# Patient Record
Sex: Female | Born: 1955 | Race: White | Hispanic: No | Smoking: Never smoker
Health system: Southern US, Community
[De-identification: ages and names within clinical notes are randomized; demographics above are authoritative.]

## PROBLEM LIST (undated history)

## (undated) DIAGNOSIS — R0609 Other forms of dyspnea: Secondary | ICD-10-CM

## (undated) DIAGNOSIS — I10 Essential (primary) hypertension: Secondary | ICD-10-CM

## (undated) DIAGNOSIS — G629 Polyneuropathy, unspecified: Secondary | ICD-10-CM

## (undated) DIAGNOSIS — F988 Other specified behavioral and emotional disorders with onset usually occurring in childhood and adolescence: Secondary | ICD-10-CM

## (undated) DIAGNOSIS — R7301 Impaired fasting glucose: Secondary | ICD-10-CM

## (undated) DIAGNOSIS — E119 Type 2 diabetes mellitus without complications: Secondary | ICD-10-CM

## (undated) DIAGNOSIS — F32A Depression, unspecified: Secondary | ICD-10-CM

## (undated) DIAGNOSIS — J302 Other seasonal allergic rhinitis: Secondary | ICD-10-CM

## (undated) DIAGNOSIS — K219 Gastro-esophageal reflux disease without esophagitis: Secondary | ICD-10-CM

## (undated) DIAGNOSIS — Q2544 Congenital dilation of aorta: Secondary | ICD-10-CM

## (undated) DIAGNOSIS — L209 Atopic dermatitis, unspecified: Secondary | ICD-10-CM

## (undated) HISTORY — DX: Depression, unspecified: F32.A

## (undated) HISTORY — DX: Atopic dermatitis, unspecified: L20.9

## (undated) HISTORY — DX: Polyneuropathy, unspecified: G62.9

## (undated) HISTORY — DX: Other specified behavioral and emotional disorders with onset usually occurring in childhood and adolescence: F98.8

## (undated) HISTORY — DX: Congenital dilation of aorta: Q25.44

## (undated) HISTORY — DX: Other seasonal allergic rhinitis: J30.2

## (undated) HISTORY — DX: Essential (primary) hypertension: I10

## (undated) HISTORY — PX: OTHER SURGICAL HISTORY: SHX169

## (undated) HISTORY — DX: Gastro-esophageal reflux disease without esophagitis: K21.9

## (undated) HISTORY — DX: Other forms of dyspnea: R06.09

## (undated) HISTORY — DX: Impaired fasting glucose: R73.01

---

## 1998-08-03 ENCOUNTER — Other Ambulatory Visit: Admission: RE | Admit: 1998-08-03 | Discharge: 1998-08-03 | Payer: Self-pay | Admitting: Obstetrics & Gynecology

## 1999-12-30 ENCOUNTER — Encounter: Payer: Self-pay | Admitting: Internal Medicine

## 1999-12-30 ENCOUNTER — Encounter: Admission: RE | Admit: 1999-12-30 | Discharge: 1999-12-30 | Payer: Self-pay | Admitting: Internal Medicine

## 1999-12-30 ENCOUNTER — Encounter: Admission: RE | Admit: 1999-12-30 | Discharge: 2000-03-29 | Payer: Self-pay | Admitting: Internal Medicine

## 2000-01-10 ENCOUNTER — Encounter: Payer: Self-pay | Admitting: Internal Medicine

## 2000-01-10 ENCOUNTER — Encounter: Admission: RE | Admit: 2000-01-10 | Discharge: 2000-01-10 | Payer: Self-pay | Admitting: Internal Medicine

## 2006-09-26 ENCOUNTER — Emergency Department (HOSPITAL_COMMUNITY): Admission: EM | Admit: 2006-09-26 | Discharge: 2006-09-26 | Payer: Self-pay | Admitting: Emergency Medicine

## 2008-06-17 ENCOUNTER — Other Ambulatory Visit: Admission: RE | Admit: 2008-06-17 | Discharge: 2008-06-17 | Payer: Self-pay | Admitting: Internal Medicine

## 2011-05-18 ENCOUNTER — Ambulatory Visit (INDEPENDENT_AMBULATORY_CARE_PROVIDER_SITE_OTHER): Payer: Federal, State, Local not specified - PPO

## 2011-05-18 DIAGNOSIS — J411 Mucopurulent chronic bronchitis: Secondary | ICD-10-CM

## 2011-05-18 DIAGNOSIS — J343 Hypertrophy of nasal turbinates: Secondary | ICD-10-CM

## 2011-05-18 DIAGNOSIS — L241 Irritant contact dermatitis due to oils and greases: Secondary | ICD-10-CM

## 2011-12-25 ENCOUNTER — Ambulatory Visit: Payer: Federal, State, Local not specified - PPO

## 2011-12-25 ENCOUNTER — Ambulatory Visit (INDEPENDENT_AMBULATORY_CARE_PROVIDER_SITE_OTHER): Payer: Federal, State, Local not specified - PPO | Admitting: Emergency Medicine

## 2011-12-25 VITALS — BP 109/81 | HR 73 | Temp 97.5°F | Resp 18 | Ht 65.0 in | Wt 161.0 lb

## 2011-12-25 DIAGNOSIS — M25579 Pain in unspecified ankle and joints of unspecified foot: Secondary | ICD-10-CM

## 2011-12-25 DIAGNOSIS — S93409A Sprain of unspecified ligament of unspecified ankle, initial encounter: Secondary | ICD-10-CM

## 2011-12-25 MED ORDER — NAPROXEN SODIUM 550 MG PO TABS: 550.0000 mg | ORAL_TABLET | Freq: Two times a day (BID) | ORAL | Status: DC

## 2011-12-25 NOTE — Progress Notes (Signed)
   Date:  12/25/2011   Name:  Karina Lynch   DOB:  [birth year]   MRN:  [id number] Gender: female Age: 56 y.o.  PCP:  No primary provider on file.    Chief Complaint: Ankle Pain   History of Present Illness:  Karina Lynch is a 56 y.o. pleasant patient who presents with the following:  Injured in aerobics and has persistent pain in left lateral ankle.  Not improving rapidly but is less painful than initially.  Has persistent lump in lateral posterior foot and ankle.  There is no problem list on file for this patient.   No past medical history on file.  No past surgical history on file.  History  Substance Use Topics  . Smoking status: Never Smoker   . Smokeless tobacco: Not on file  . Alcohol Use: Not on file    No family history on file.  No Known Allergies  Medication list has been reviewed and updated.  Current Outpatient Prescriptions on File Prior to Visit  Medication Sig Dispense Refill  . amphetamine-dextroamphetamine (ADDERALL) 20 MG tablet Take 20 mg by mouth daily.      . temazepam (RESTORIL) 15 MG capsule Take 15 mg by mouth at bedtime as needed.        Review of Systems:  As per HPI, otherwise negative.    Physical Examination: Filed Vitals:   12/25/11 1142  BP: 109/81  Pulse: 73  Temp: 97.5 F (36.4 C)  Resp: 18   Filed Vitals:   12/25/11 1142  Height: 5\' 5"  (1.651 m)  Weight: 161 lb (73.029 kg)   Body mass index is 26.79 kg/(m^2). Ideal Body Weight: Weight in (lb) to have BMI = 25: 149.9    GEN: WDWN, NAD, Non-toxic, Alert & Oriented x 3 HEENT: Atraumatic, Normocephalic.  Ears and Nose: No external deformity. EXTR: No clubbing/cyanosis/edema.  Tender fullness just distal and posterior to lateral malleolus.  Pain with eversion of ankle.  NATI NEURO: Normal gait.  PSYCH: Normally interactive. Conversant. Not depressed or anxious appearing.  Calm demeanor.    Assessment and Plan: Ankle sprain Air cast Anaprox Follow up as  needed  Carmelina Dane, MD

## 2011-12-25 NOTE — Progress Notes (Signed)
  Subjective:    Patient ID: Karina Lynch, female    DOB: 1956/01/06, 56 y.o.   MRN: [id number]  HPI    Review of Systems     Objective:   Physical Exam        Assessment & Plan:  [facility] reading (PRIMARY) by  Dr. Dareen Piano. Negative .

## 2012-06-03 ENCOUNTER — Ambulatory Visit (HOSPITAL_COMMUNITY)
Admission: RE | Admit: 2012-06-03 | Discharge: 2012-06-03 | Disposition: A | Discharge status: Home / Self Care | Payer: Federal, State, Local not specified - PPO | Source: Ambulatory Visit | Attending: Physician Assistant | Admitting: Physician Assistant

## 2012-06-03 ENCOUNTER — Ambulatory Visit (INDEPENDENT_AMBULATORY_CARE_PROVIDER_SITE_OTHER): Payer: Federal, State, Local not specified - PPO | Admitting: Family Medicine

## 2012-06-03 ENCOUNTER — Ambulatory Visit: Payer: Federal, State, Local not specified - PPO

## 2012-06-03 VITALS — BP 115/69 | HR 95 | Temp 98.6°F | Resp 16 | Ht 66.5 in | Wt 168.0 lb

## 2012-06-03 DIAGNOSIS — R05 Cough: Secondary | ICD-10-CM

## 2012-06-03 DIAGNOSIS — N390 Urinary tract infection, site not specified: Secondary | ICD-10-CM

## 2012-06-03 DIAGNOSIS — R300 Dysuria: Secondary | ICD-10-CM

## 2012-06-03 DIAGNOSIS — R0602 Shortness of breath: Secondary | ICD-10-CM

## 2012-06-03 DIAGNOSIS — R059 Cough, unspecified: Secondary | ICD-10-CM

## 2012-06-03 DIAGNOSIS — F909 Attention-deficit hyperactivity disorder, unspecified type: Secondary | ICD-10-CM | POA: Insufficient documentation

## 2012-06-03 DIAGNOSIS — G4700 Insomnia, unspecified: Secondary | ICD-10-CM | POA: Insufficient documentation

## 2012-06-03 LAB — POCT UA - MICROSCOPIC ONLY
Crystals, Ur, HPF, POC: NEGATIVE
Mucus, UA: NEGATIVE
Yeast, UA: NEGATIVE

## 2012-06-03 LAB — POCT URINALYSIS DIPSTICK
Bilirubin, UA: NEGATIVE
Glucose, UA: NEGATIVE
Nitrite, UA: NEGATIVE

## 2012-06-03 LAB — CBC
Platelets: 275 10*3/uL (ref 150–400)
RBC: 3.96 MIL/uL (ref 3.87–5.11)
RDW: 13.4 % (ref 11.5–15.5)
WBC: 14.7 10*3/uL — ABNORMAL HIGH (ref 4.0–10.5)

## 2012-06-03 LAB — D-DIMER, QUANTITATIVE: D-Dimer, Quant: 2.2 ug/mL-FEU — ABNORMAL HIGH (ref 0.00–0.48)

## 2012-06-03 MED ORDER — IOHEXOL 350 MG/ML SOLN
100.0000 mL | Freq: Once | INTRAVENOUS | Status: AC | PRN
  Administered 2012-06-03: 80 mL via INTRAVENOUS

## 2012-06-03 MED ORDER — LEVOFLOXACIN 500 MG PO TABS: 500.0000 mg | ORAL_TABLET | Freq: Every day | ORAL | Status: DC

## 2012-06-03 MED ORDER — HYDROCOD POLST-CHLORPHEN POLST 10-8 MG/5ML PO LQCR: 5.0000 mL | Freq: Two times a day (BID) | ORAL | Status: DC

## 2012-06-03 NOTE — Progress Notes (Signed)
[address] [place] [address]   Phone [phone]  Subjective:    Patient ID: Karina Lynch, female    DOB: 1955/10/07, 57 y.o.   MRN: [id number]  HPI Pt presents to clinic with 2 complaints - some dysuria over the last several days at the end of her stream - she has no urgency or frequency - she has no back pain and no fevers today - she did wake up last pm with wet sheets - and had fevers last week when she had flu-like symptoms.  Her flu-like symptoms started 1 wk ago and she had myalgias and fevers but that is all better.  She has a slight cough but seems to be related to deep breaths and is better today.  She has had some SOB but it is not significant.  She has no chest pain, no leg pain.  She laid around a little more than normal last week.  She has taken no medications for her symptoms.     Review of Systems  Constitutional: Positive for fever (last night - subj fever). Negative for chills.  HENT: Positive for congestion and postnasal drip. Negative for sore throat and rhinorrhea.   Respiratory: Positive for cough and shortness of breath (slightly).   Genitourinary: Positive for dysuria. Negative for urgency, frequency and hematuria.  Neurological: Negative for headaches.       Objective:   Physical Exam  Vitals reviewed. Constitutional: She is oriented to person, place, and time. She appears well-developed and well-nourished.  HENT:  Head: Normocephalic and atraumatic.  Right Ear: Hearing, tympanic membrane, external ear and ear canal normal.  Left Ear: Hearing, tympanic membrane, external ear and ear canal normal.  Nose: Mucosal edema (zred) present.  Mouth/Throat: Uvula is midline, oropharynx is clear and moist and mucous membranes are normal.  Eyes: Conjunctivae normal are normal.  Cardiovascular: Normal rate, regular rhythm and normal heart sounds.   Pulmonary/Chest: Effort normal. She has no decreased breath sounds. She has no wheezes.       Pt sounds winded  during taking her history - unable to finish full sentences but pt does not feel like it is due to nasal congestion -   Abdominal: There is no CVA tenderness.  Musculoskeletal:       No calf tenderness - no palpable cords  Neurological: She is alert and oriented to person, place, and time.  Skin: Skin is warm and dry.  Psychiatric: She has a normal mood and affect. Her behavior is normal. Judgment and thought content normal.    Results for orders placed in visit on 06/03/12  POCT UA - MICROSCOPIC ONLY      Component Value Range   WBC, Ur, HPF, POC tntc     RBC, urine, microscopic 2-5     Bacteria, U Microscopic trace     Mucus, UA neg     Epithelial cells, urine per micros 0-1     Crystals, Ur, HPF, POC neg     Casts, Ur, LPF, POC renal tubular     Yeast, UA neg    POCT URINALYSIS DIPSTICK      Component Value Range   Color, UA yellow     Clarity, UA cloudy     Glucose, UA neg     Bilirubin, UA neg     Ketones, UA neg     Spec Grav, UA <=1.005     Blood, UA moderate     pH, UA 5.5  Protein, UA 30     Urobilinogen, UA 0.2     Nitrite, UA neg     Leukocytes, UA large (3+)     [facility] reading (PRIMARY) by  Karina Lynch. Neg.       Assessment & Plan:   1. Dysuria  POCT UA - Microscopic Only, POCT urinalysis dipstick  2. UTI (lower urinary tract infection)  levofloxacin (LEVAQUIN) 500 MG tablet, Urine culture  3. SOB (shortness of breath)  DG Chest 2 View, D-dimer, quantitative, CBC  4. Cough  chlorpheniramine-HYDROcodone (TUSSIONEX PENNKINETIC ER) 10-8 MG/5ML LQCR    1/2 - treat for UTI - do not think pyelonephritis due to lack of nausea, fever and CVA tenderness 3/4 - due to normal HR and normal pulse ox think low likelihood of PE but will do a D dimer - ? Early pneumonia that is not evident on CXR due to elevated white count -  will use Levaquin to cover UTI/pyelonephritis and PNA  Answered pt's questions - she understands and agrees with the above.  D/with Karina  Dareen Lynch.    After patient left - D Dimer returned high so called patient and sent her to [facility] for CT angiogram - report will be called to Karina Lynch.  If PE will go to ED and if normal - ok to home.

## 2012-06-05 LAB — URINE CULTURE

## 2012-07-17 ENCOUNTER — Encounter: Payer: Self-pay | Admitting: Physician Assistant

## 2014-01-15 IMAGING — CT CT ANGIO CHEST
1 of 2 series · 19 of 32 positions shown · IV contrast (OMNIPAQUE 300)
Comparison: None.

CLINICAL DATA: Shortness of breath with positive D-dimer.

CT ANGIOGRAPHY CHEST
TECHNIQUE: Multidetector CT imaging of the chest using the
standard protocol during bolus administration of intravenous
contrast. Multiplanar reconstructed images including MIPs were
obtained and reviewed to evaluate the vascular anatomy.
Contrast: 80mL OMNIPAQUE IOHEXOL 350 MG/ML SOLN

[Series 11: thins for pacs · axial · 0.74mm/px · z∈[+1272,+1488]mm · 19 of 239 slices shown]
[im 12/239  lung]
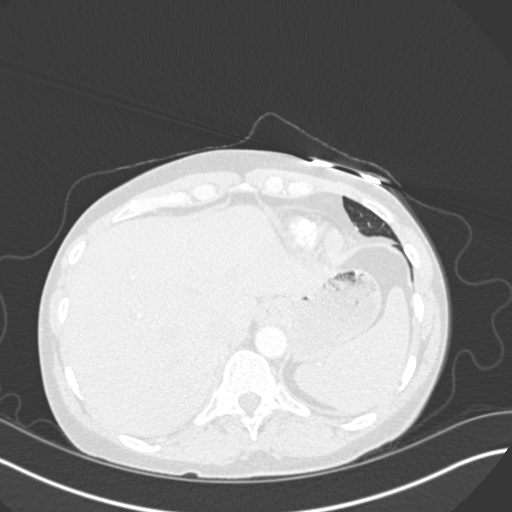
[im 24/239  mediastinal]
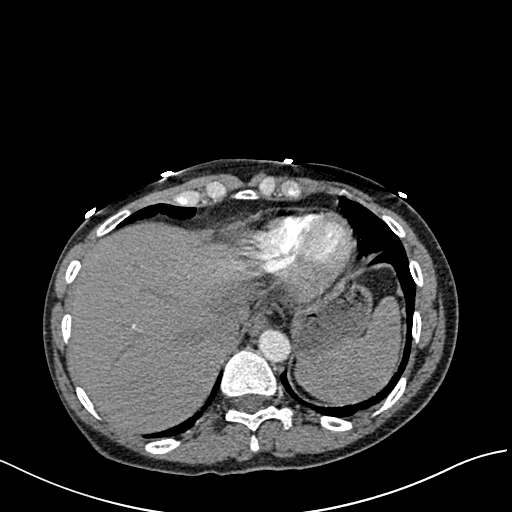
[im 36/239  lung]
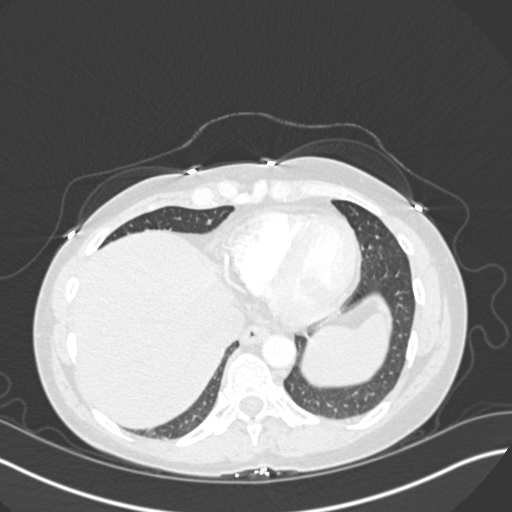
[im 60/239  mediastinal]
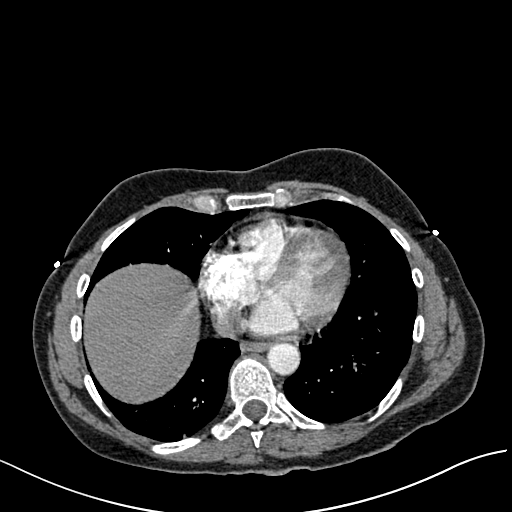
[im 72/239  lung]
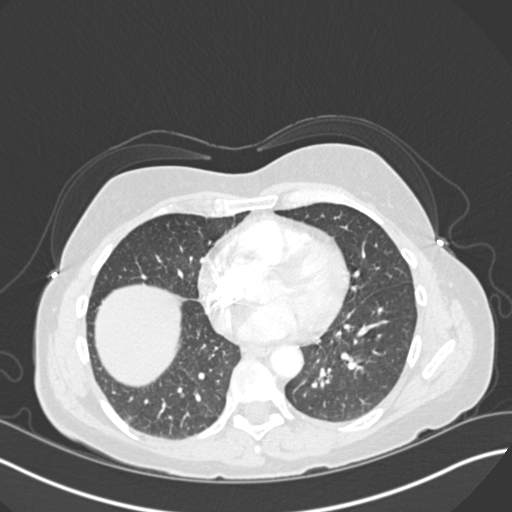
[im 80/239  mediastinal]
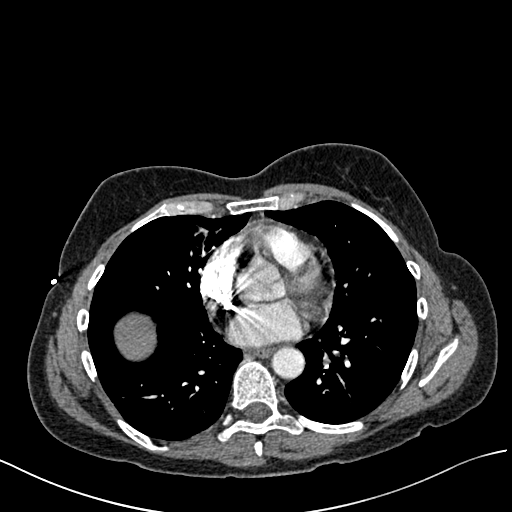
[im 84/239  lung]
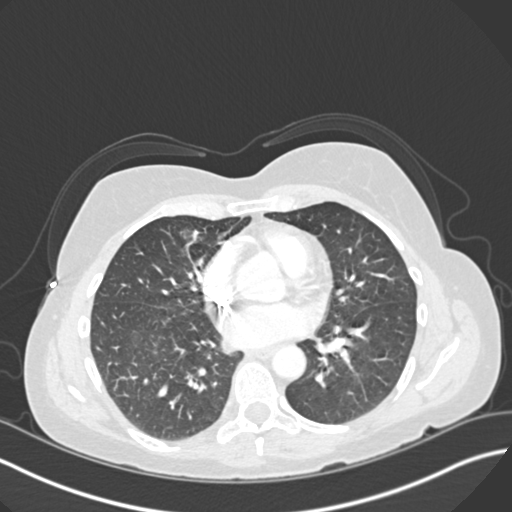
[im 96/239  mediastinal]
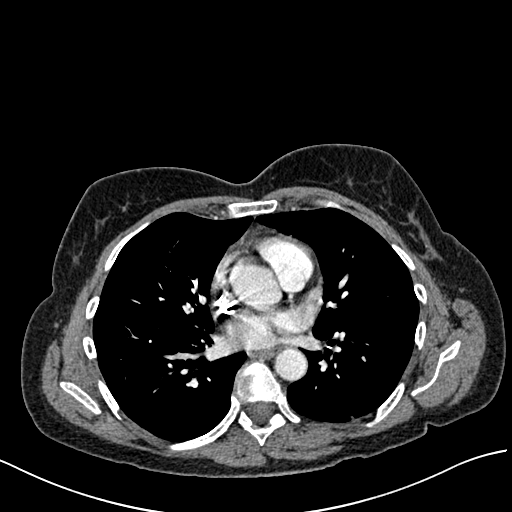
[im 108/239  lung]
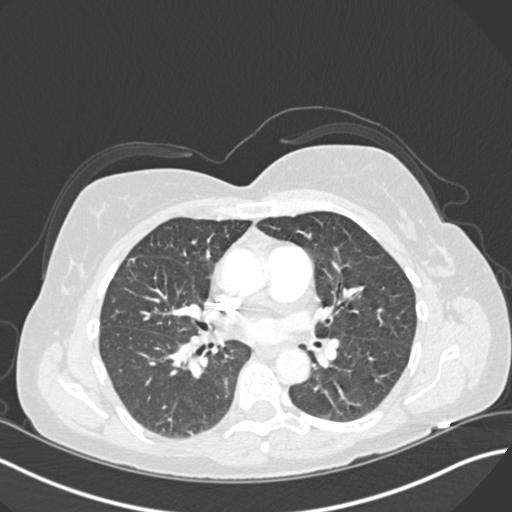
[im 120/239  mediastinal]
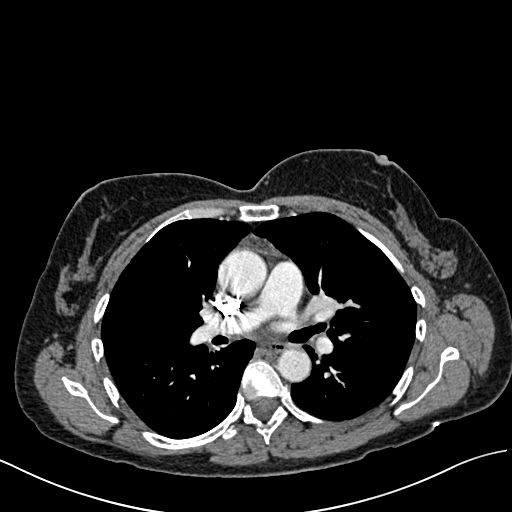
[im 131/239  lung]
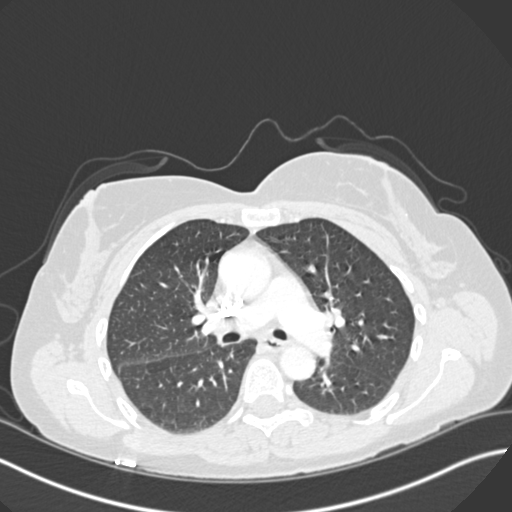
[im 143/239  mediastinal]
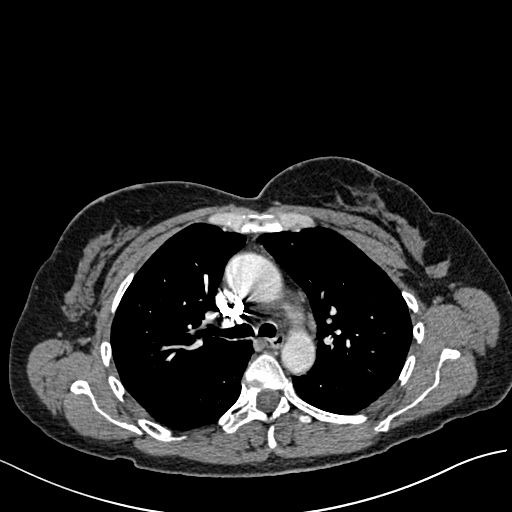
[im 155/239  lung]
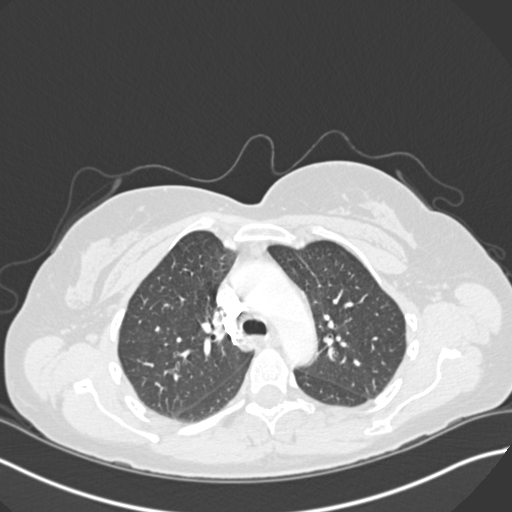
[im 159/239  mediastinal]
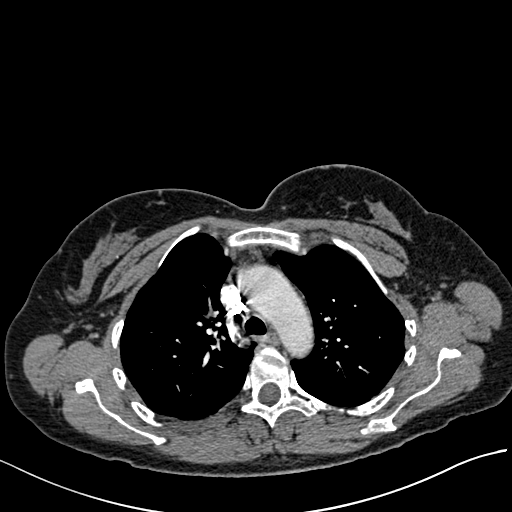
[im 167/239  lung]
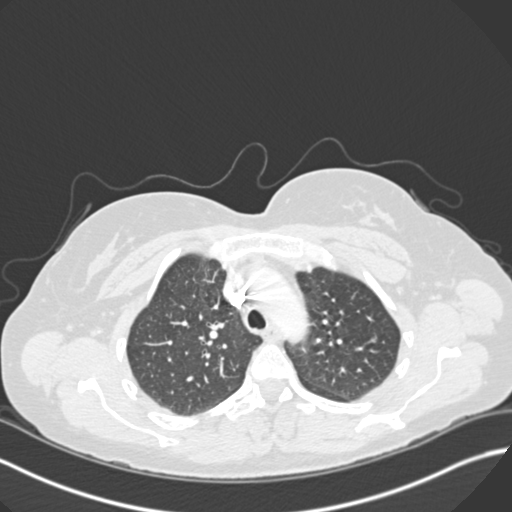
[im 179/239  mediastinal]
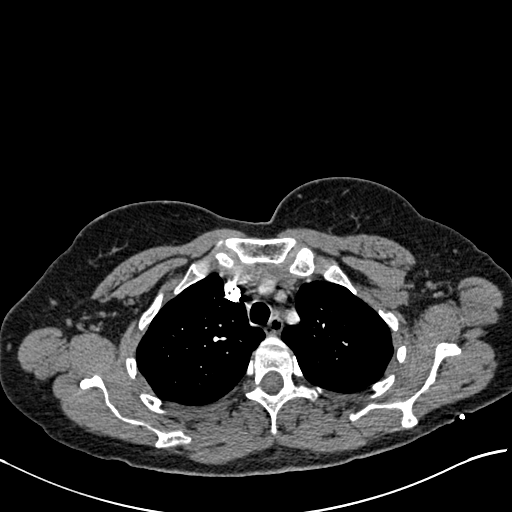
[im 203/239  lung]
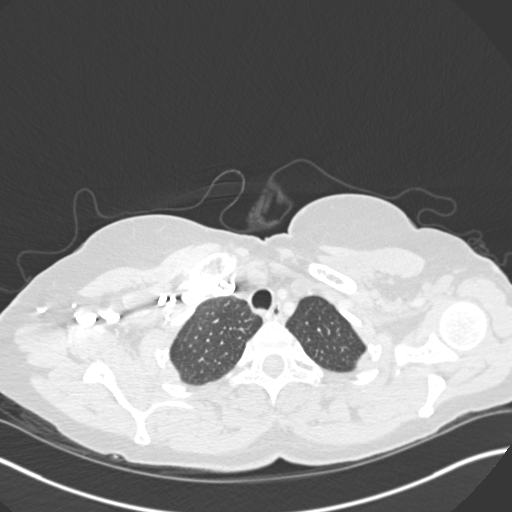
[im 215/239  mediastinal]
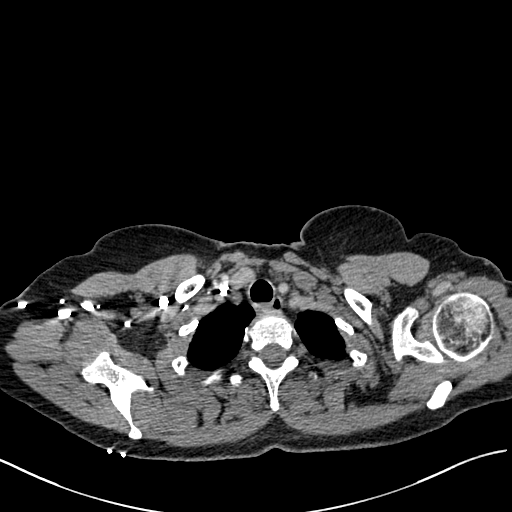
[im 227/239  lung]
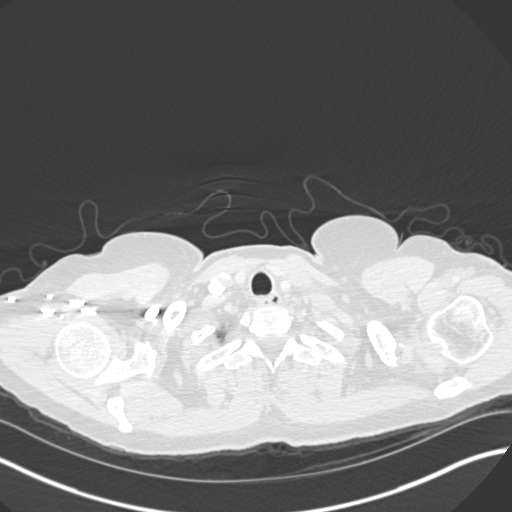

[19 of 32 positions shown; findings below may reference images not displayed]

FINDINGS: Technically adequate study with moderately good
opacification of the central and segmental pulmonary arteries.
Peripheral vessels are not well opacified.  No central filling
defects are appreciated, suggesting no evidence of significant
central pulmonary embolus.

Normal heart size.  Normal caliber thoracic aorta.  No significant
lymphadenopathy in the chest.  The esophagus is decompressed.
Visualized portions of the upper abdominal organs are grossly
unremarkable.  No pleural effusions.  No pneumothorax.  No
parenchymal airspace or interstitial disease.  No focal
consolidation.  Airways appear patent.  Normal alignment of the
thoracic vertebrae.
IMPRESSION: No evidence of significant pulmonary embolus.  No evidence of
active pulmonary disease.

## 2014-01-15 IMAGING — CR DG CHEST 2V
2 series · 2 of 2 positions shown · non-contrast
Comparison: None.

CLINICAL DATA: Shortness of breath.

CHEST - 2 VIEW

[PA]
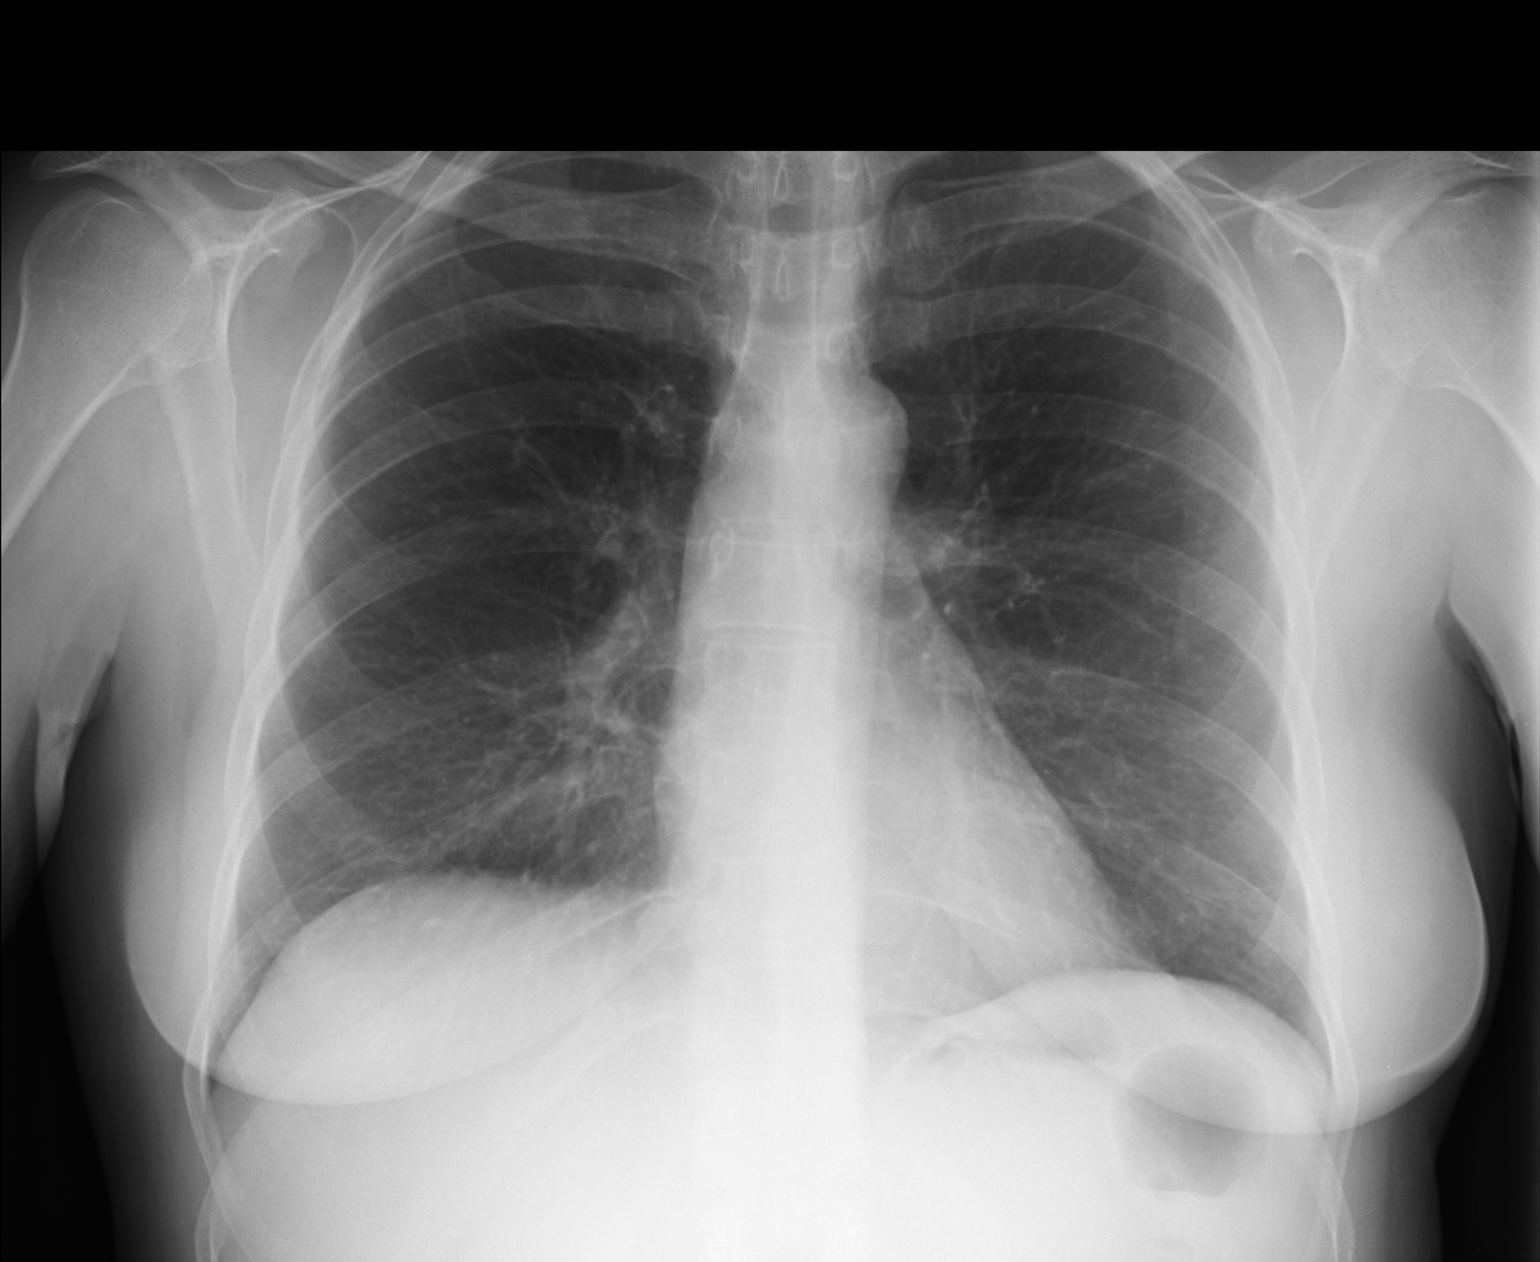

[lateral]
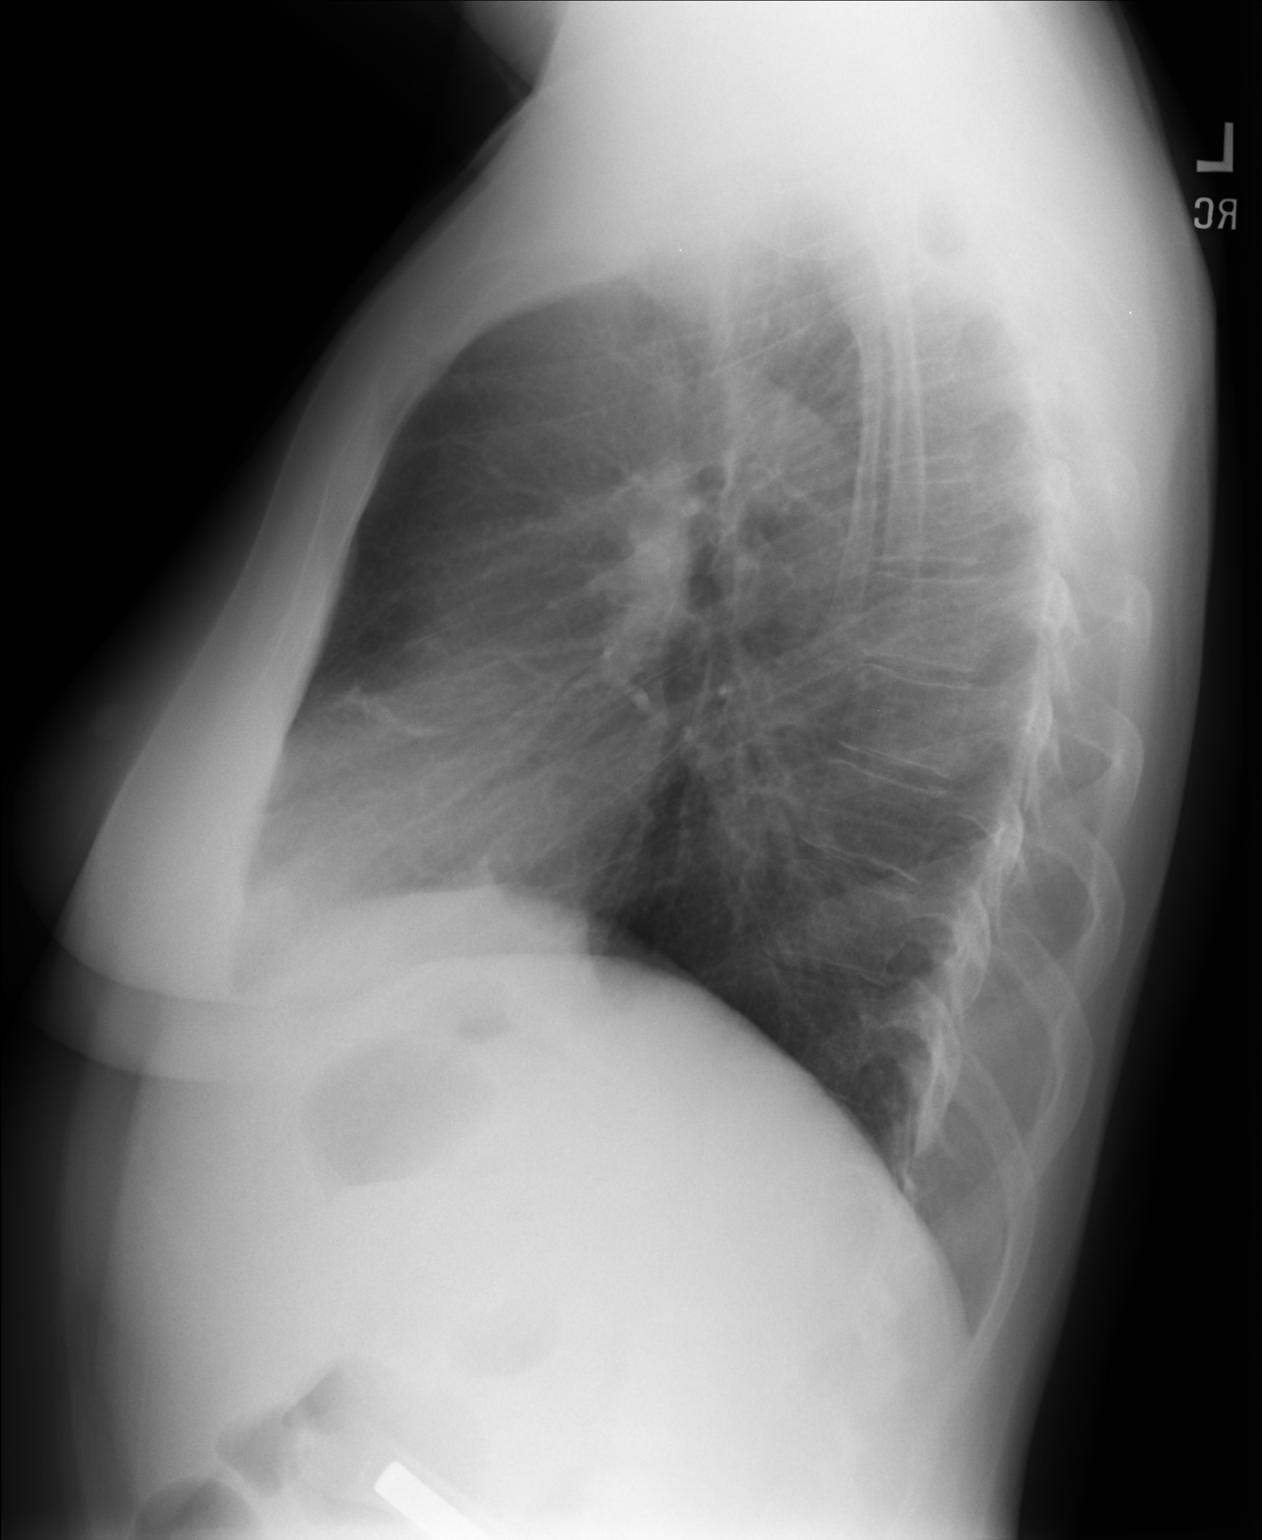

[2 of 2 positions shown; findings below may reference images not displayed]

FINDINGS: The lungs are clear.  Heart size is normal.  There is no
pneumothorax or pleural fluid.
IMPRESSION: Negative chest.

## 2020-06-03 DIAGNOSIS — Z0000 Encounter for general adult medical examination without abnormal findings: Secondary | ICD-10-CM | POA: Diagnosis not present

## 2020-07-01 DIAGNOSIS — Z0000 Encounter for general adult medical examination without abnormal findings: Secondary | ICD-10-CM | POA: Diagnosis not present

## 2020-07-01 DIAGNOSIS — I10 Essential (primary) hypertension: Secondary | ICD-10-CM | POA: Diagnosis not present

## 2020-07-01 DIAGNOSIS — K219 Gastro-esophageal reflux disease without esophagitis: Secondary | ICD-10-CM | POA: Diagnosis not present

## 2020-07-01 DIAGNOSIS — F329 Major depressive disorder, single episode, unspecified: Secondary | ICD-10-CM | POA: Diagnosis not present

## 2020-07-01 DIAGNOSIS — R7301 Impaired fasting glucose: Secondary | ICD-10-CM | POA: Diagnosis not present

## 2020-07-08 DIAGNOSIS — F4010 Social phobia, unspecified: Secondary | ICD-10-CM | POA: Diagnosis not present

## 2020-07-08 DIAGNOSIS — G4700 Insomnia, unspecified: Secondary | ICD-10-CM | POA: Diagnosis not present

## 2020-07-08 DIAGNOSIS — F902 Attention-deficit hyperactivity disorder, combined type: Secondary | ICD-10-CM | POA: Diagnosis not present

## 2020-08-05 DIAGNOSIS — I10 Essential (primary) hypertension: Secondary | ICD-10-CM | POA: Diagnosis not present

## 2020-08-05 DIAGNOSIS — R0600 Dyspnea, unspecified: Secondary | ICD-10-CM | POA: Diagnosis not present

## 2020-08-05 DIAGNOSIS — R110 Nausea: Secondary | ICD-10-CM | POA: Diagnosis not present

## 2020-08-26 DIAGNOSIS — E669 Obesity, unspecified: Secondary | ICD-10-CM | POA: Diagnosis not present

## 2020-08-26 DIAGNOSIS — K219 Gastro-esophageal reflux disease without esophagitis: Secondary | ICD-10-CM | POA: Diagnosis not present

## 2020-08-26 DIAGNOSIS — I10 Essential (primary) hypertension: Secondary | ICD-10-CM | POA: Diagnosis not present

## 2020-10-06 DIAGNOSIS — F4010 Social phobia, unspecified: Secondary | ICD-10-CM | POA: Diagnosis not present

## 2020-10-06 DIAGNOSIS — F902 Attention-deficit hyperactivity disorder, combined type: Secondary | ICD-10-CM | POA: Diagnosis not present

## 2020-10-06 DIAGNOSIS — G4700 Insomnia, unspecified: Secondary | ICD-10-CM | POA: Diagnosis not present

## 2020-11-25 DIAGNOSIS — K219 Gastro-esophageal reflux disease without esophagitis: Secondary | ICD-10-CM | POA: Diagnosis not present

## 2020-11-25 DIAGNOSIS — F329 Major depressive disorder, single episode, unspecified: Secondary | ICD-10-CM | POA: Diagnosis not present

## 2020-11-25 DIAGNOSIS — E669 Obesity, unspecified: Secondary | ICD-10-CM | POA: Diagnosis not present

## 2020-11-25 DIAGNOSIS — I10 Essential (primary) hypertension: Secondary | ICD-10-CM | POA: Diagnosis not present

## 2021-01-05 DIAGNOSIS — F4010 Social phobia, unspecified: Secondary | ICD-10-CM | POA: Diagnosis not present

## 2021-01-05 DIAGNOSIS — F902 Attention-deficit hyperactivity disorder, combined type: Secondary | ICD-10-CM | POA: Diagnosis not present

## 2021-01-05 DIAGNOSIS — G4700 Insomnia, unspecified: Secondary | ICD-10-CM | POA: Diagnosis not present

## 2021-01-18 DIAGNOSIS — R0600 Dyspnea, unspecified: Secondary | ICD-10-CM | POA: Diagnosis not present

## 2021-01-18 DIAGNOSIS — I10 Essential (primary) hypertension: Secondary | ICD-10-CM | POA: Diagnosis not present

## 2021-01-18 DIAGNOSIS — R7301 Impaired fasting glucose: Secondary | ICD-10-CM | POA: Diagnosis not present

## 2021-04-13 DIAGNOSIS — F4010 Social phobia, unspecified: Secondary | ICD-10-CM | POA: Diagnosis not present

## 2021-04-13 DIAGNOSIS — G4700 Insomnia, unspecified: Secondary | ICD-10-CM | POA: Diagnosis not present

## 2021-04-13 DIAGNOSIS — F902 Attention-deficit hyperactivity disorder, combined type: Secondary | ICD-10-CM | POA: Diagnosis not present

## 2021-05-16 DIAGNOSIS — F330 Major depressive disorder, recurrent, mild: Secondary | ICD-10-CM | POA: Diagnosis not present

## 2021-05-16 DIAGNOSIS — I10 Essential (primary) hypertension: Secondary | ICD-10-CM | POA: Diagnosis not present

## 2021-05-16 DIAGNOSIS — R0609 Other forms of dyspnea: Secondary | ICD-10-CM | POA: Diagnosis not present

## 2021-05-26 DIAGNOSIS — I77810 Thoracic aortic ectasia: Secondary | ICD-10-CM | POA: Diagnosis not present

## 2021-05-26 DIAGNOSIS — I351 Nonrheumatic aortic (valve) insufficiency: Secondary | ICD-10-CM | POA: Diagnosis not present

## 2021-05-26 DIAGNOSIS — R0609 Other forms of dyspnea: Secondary | ICD-10-CM | POA: Diagnosis not present

## 2021-05-26 DIAGNOSIS — Z1231 Encounter for screening mammogram for malignant neoplasm of breast: Secondary | ICD-10-CM | POA: Diagnosis not present

## 2021-05-26 DIAGNOSIS — I358 Other nonrheumatic aortic valve disorders: Secondary | ICD-10-CM | POA: Diagnosis not present

## 2021-05-30 DIAGNOSIS — R053 Chronic cough: Secondary | ICD-10-CM | POA: Diagnosis not present

## 2021-06-21 DIAGNOSIS — F4010 Social phobia, unspecified: Secondary | ICD-10-CM | POA: Diagnosis not present

## 2021-06-21 DIAGNOSIS — G4700 Insomnia, unspecified: Secondary | ICD-10-CM | POA: Diagnosis not present

## 2021-06-21 DIAGNOSIS — F902 Attention-deficit hyperactivity disorder, combined type: Secondary | ICD-10-CM | POA: Diagnosis not present

## 2021-06-30 DIAGNOSIS — R0609 Other forms of dyspnea: Secondary | ICD-10-CM | POA: Diagnosis not present

## 2021-06-30 DIAGNOSIS — Q2544 Congenital dilation of aorta: Secondary | ICD-10-CM | POA: Diagnosis not present

## 2021-06-30 DIAGNOSIS — I10 Essential (primary) hypertension: Secondary | ICD-10-CM | POA: Diagnosis not present

## 2021-07-12 ENCOUNTER — Telehealth: Payer: Self-pay

## 2021-07-12 NOTE — Telephone Encounter (Signed)
NOTES SCANNED TO REFERRAL 

## 2021-07-20 DIAGNOSIS — G4700 Insomnia, unspecified: Secondary | ICD-10-CM | POA: Diagnosis not present

## 2021-07-20 DIAGNOSIS — F4010 Social phobia, unspecified: Secondary | ICD-10-CM | POA: Diagnosis not present

## 2021-07-20 DIAGNOSIS — F902 Attention-deficit hyperactivity disorder, combined type: Secondary | ICD-10-CM | POA: Diagnosis not present

## 2021-07-26 DIAGNOSIS — Z0000 Encounter for general adult medical examination without abnormal findings: Secondary | ICD-10-CM | POA: Diagnosis not present

## 2021-07-26 DIAGNOSIS — F330 Major depressive disorder, recurrent, mild: Secondary | ICD-10-CM | POA: Diagnosis not present

## 2021-07-26 DIAGNOSIS — R7301 Impaired fasting glucose: Secondary | ICD-10-CM | POA: Diagnosis not present

## 2021-07-26 DIAGNOSIS — Z23 Encounter for immunization: Secondary | ICD-10-CM | POA: Diagnosis not present

## 2021-07-27 ENCOUNTER — Ambulatory Visit: Payer: Federal, State, Local not specified - PPO | Admitting: Cardiology

## 2021-07-27 ENCOUNTER — Encounter: Payer: Self-pay | Admitting: Cardiology

## 2021-07-27 DIAGNOSIS — F909 Attention-deficit hyperactivity disorder, unspecified type: Secondary | ICD-10-CM

## 2021-07-27 DIAGNOSIS — I10 Essential (primary) hypertension: Secondary | ICD-10-CM | POA: Diagnosis not present

## 2021-07-27 DIAGNOSIS — R0609 Other forms of dyspnea: Secondary | ICD-10-CM | POA: Diagnosis not present

## 2021-07-27 NOTE — Progress Notes (Signed)
?Cardiology Office Note:   ? ?Date:  07/27/2021  ? ?ID:  Karina Lynch, DOB 1955/12/04, MRN [id number] ? ?PCP:  Pcp, No  ?Cardiologist:  Donato Schultz, MD   ? ?Referring MD: Su Hoff, MD  ? ? ? ?History of Present Illness:   ? ?Karina Lynch is a 66 y.o. female with a hx of hypertension, congenital dilation of aortic arch, and GERD here today for the evaluation of dyspnea on exertion and congenital aortic arch dilation at the request of Dr. Beckie Salts. ? ?She last saw Dr. Beckie Salts 06/30/21 for dyspnea on exertion developed after COVID infection 10/2020. The dyspnea worsened throughout the day and had associated wheezing and fatigue. Echo showed LVEF 54%, trace AR, and mildly dilated aortic arch. Her ADD therapist recommended she see a cardiologist.  ? ?Today, she is doing well. She was told her aortic dilation was not severe to the point of causing her shortness of breath. 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 She has never smoked. She reports her father had a condition where his lungs had shrunk that was believed to be due to untreated sleep apnea. She denies any palpitations, chest pain, lightheadedness, headaches, syncope, orthopnea, PND, lower extremity edema or exertional symptoms. ? ?Past Medical History:  ?Diagnosis Date  ? ADD (attention deficit disorder)   ? Atopic dermatitis   ? Congenital dilation of aortic arch   ? DOE (dyspnea on exertion)   ? DOE (dyspnea on exertion)   ? GERD (gastroesophageal reflux disease)   ? HTN (hypertension)   ? IFG (impaired fasting glucose)   ? Neuropathy   ? Seasonal allergies   ? ? ?Past Surgical History:  ?Procedure Laterality Date  ? orif left 5th finger    ? righr ulnar nerve tumor excision    ? ? ?Current Medications: ?Current Meds  ?Medication Sig  ? albuterol (VENTOLIN HFA) 108 (90 Base) MCG/ACT inhaler Inhale 1 puff into the lungs as needed.  ? buPROPion (WELLBUTRIN XL) 300 MG 24 hr tablet Take 1 tablet by mouth  daily.  ? chlorthalidone (HYGROTON) 25 MG tablet Take 0.5 tablets by mouth daily.  ? Dexmethylphenidate HCl 40 MG CP24 Take 1 capsule by mouth every morning.  ? losartan (COZAAR) 100 MG tablet Take 100 mg by mouth daily.  ? methylphenidate (RITALIN) 20 MG tablet Take 20-40 mg by mouth daily as needed.  ? pantoprazole (PROTONIX) 40 MG tablet Take 40 mg by mouth daily.  ?  ? ?Allergies:   Patient has no known allergies.  ? ?Social History  ? ?Socioeconomic History  ? Marital status: Divorced  ?  Spouse name: Not on file  ? Number of children: Not on file  ? Years of education: Not on file  ? Highest education level: Not on file  ?Occupational History  ? Not on file  ?Tobacco Use  ? Smoking status: Never  ? Smokeless tobacco: Not on file  ?Substance and Sexual Activity  ? Alcohol use: Yes  ? Drug use: No  ? Sexual activity: Not on file  ?Other Topics Concern  ? Not on file  ?Social History Narrative  ? Not on file  ? ?Social Determinants of Health  ? ?Financial Resource Strain: Not on file  ?Food Insecurity: Not on file  ?Transportation Needs: Not on file  ?Physical Activity: Not on file  ?Stress: Not on file  ?Social Connections: Not on file  ?  ? ?Family History: ?  The patient's family history includes Cancer in her father and mother; Diabetes in her father. ? ?ROS:   ?Please see the history of present illness.    ?(+) Shortness of breath ?All other systems reviewed and negative.  ? ?EKGs/Labs/Other Studies Reviewed:   ? ?The following studies were reviewed today: ?TTE 05/26/21 ([facility]) ?1. Left ventricle: The left ventricular cavity size is normal. Wall thickness is normal. Systolic function is normal. The ejection fraction is 54% (+/-5%), calculated using biplane MOD. No segmental wall motion abnormalities. Overall assessment of diastolic function is normal with normal estimate of left atrial pressure.  ?2. Aortic valve: There is trace-mild aortic regurgitation. The  ?   aortic regurgitation  jet is eccentric.   ?Aortic Arch 3.3 cm; Ascending aorta 3.5 cm ? ?Compared to the prior study the findings on this echo indicate no significant change. Prior study date: 03/02/2017. ? ?CTA Chest 06/03/12 ?IMPRESSION:  ?No evidence of significant pulmonary embolus.  No evidence of active pulmonary disease. ? ?Approximate measurement of ascending aorta is normal-personally reviewed and measured and interpreted. ? ?EKG:  EKG was personally reviewed ?07/27/21: Sinus tachycardia, rate 100 bpm ? ?Recent Labs: ?No results found for requested labs within last 8760 hours.  ? ?Recent Lipid Panel ?No results found for: CHOL, TRIG, HDL, CHOLHDL, VLDL, LDLCALC, LDLDIRECT ? ?CHA2DS2-VASc Score =   [ ] .  Therefore, the patient's annual risk of stroke is   %.    ?  ? ? ?Physical Exam:   ? ?VS:  BP 124/82   Pulse 100   Ht 5' 6.5" (1.689 m)   Wt 215 lb 6.4 oz (97.7 kg)   SpO2 97%   BMI 34.25 kg/m?    ? ?Wt Readings from Last 3 Encounters:  ?07/27/21 215 lb 6.4 oz (97.7 kg)  ?06/03/12 168 lb (76.2 kg)  ?12/25/11 161 lb (73 kg)  ?  ? ?GEN: Well nourished, well developed in no acute distress ?HEENT: Normal ?NECK: No JVD; No carotid bruits ?LYMPHATICS: No lymphadenopathy ?CARDIAC: Tachycardic regular, no murmurs, rubs, gallops ?RESPIRATORY:  Clear to auscultation without rales, wheezing or rhonchi  ?ABDOMEN: Soft, non-tender, non-distended ?MUSCULOSKELETAL:  No edema; No deformity  ?SKIN: Warm and dry ?NEUROLOGIC:  Alert and oriented x 3 ?PSYCHIATRIC:  Normal affect  ? ?ASSESSMENT:   ? ?1. Dyspnea on exertion   ?2. Attention deficit hyperactivity disorder (ADHD), unspecified ADHD type   ?3. Essential hypertension   ? ?PLAN:   ? ?Dyspnea on exertion ?Echocardiogram overall reassuring.  I am not concerned about her trivial to mild aortic regurgitation.  Her aortic size is also reassuring as well.  Thankfully I was able to review a prior CT scan that she had in 2014 and her aortic measurements were within normal limits. ? ?I will however  check a nuclear stress test on her to make sure that she does not have any evidence of flow limitation through her myocardium or any high risk features.  I think this would be valuable as dyspnea/dyspnea on exertion can be an anginal equivalent.  Other work-up including PE CT was normal PFTs were unremarkable. ? ? ? ?ADHD (attention deficit hyperactivity disorder) ?As of right now, I am comfortable with her utilizing methylphenidate for ADHD.  Obviously if there is any marked abnormality on the stress test that needs further investigation we will revisit.  But for right now we can use.  Mildly increased heart rate, EKG showing 100 bpm likely relates to methylphenidate use ? ?  Essential hypertension ?Currently well controlled with medication management losartan 100 mg a day chlorthalidone 25 mg a day. ? ?  ? ?Medication Adjustments/Labs and Tests Ordered: ?Current medicines are reviewed at length with the patient today.  Concerns regarding medicines are outlined above.  ?Orders Placed This Encounter  ?Procedures  ? Cardiac Stress Test: Informed Consent Details: Physician/Practitioner Attestation; Transcribe to consent form and obtain patient signature  ? MYOCARDIAL PERFUSION IMAGING  ? EKG 12-Lead  ? ?No orders of the defined types were placed in this encounter. ? ?I,Mykaella Javier,acting as a scribe for Coca ColaMark Mahalie Kanner, MD.,have documented all relevant documentation on the behalf of Donato SchultzMark Jordan Caraveo, MD,as directed by  Donato SchultzMark Jimel Myler, MD while in the presence of Donato SchultzMark Tyron Manetta, MD. ? ?I, Donato SchultzMark Bilal Manzer, MD, have reviewed all documentation for this visit. The documentation on 07/27/21 for the exam, diagnosis, procedures, and orders are all accurate and complete.  ? ?Signed, ?Donato SchultzMark Braxley Balandran, MD  ?07/27/2021 4:00 PM    ?[facility] ? ?

## 2021-07-27 NOTE — Assessment & Plan Note (Signed)
Currently well controlled with medication management losartan 100 mg a day chlorthalidone 25 mg a day. ?

## 2021-07-27 NOTE — Assessment & Plan Note (Signed)
Echocardiogram overall reassuring.  I am not concerned about her trivial to mild aortic regurgitation.  Her aortic size is also reassuring as well.  Thankfully I was able to review a prior CT scan that she had in 2014 and her aortic measurements were within normal limits. ? ?I will however check a nuclear stress test on her to make sure that she does not have any evidence of flow limitation through her myocardium or any high risk features.  I think this would be valuable as dyspnea/dyspnea on exertion can be an anginal equivalent.  Other work-up including PE CT was normal PFTs were unremarkable. ? ? ?

## 2021-07-27 NOTE — Assessment & Plan Note (Addendum)
As of right now, I am comfortable with her utilizing methylphenidate for ADHD.  Obviously if there is any marked abnormality on the stress test that needs further investigation we will revisit.  But for right now we can use.  Mildly increased heart rate, EKG showing 100 bpm likely relates to methylphenidate use ?

## 2021-07-27 NOTE — Patient Instructions (Signed)
Medication Instructions:  ?Your physician recommends that you continue on your current medications as directed. Please refer to the Current Medication list given to you today. ? ?*If you need a refill on your cardiac medications before your next appointment, please call your pharmacy* ? ?Lab Work: ?If you have labs (blood work) drawn today and your tests are completely normal, you will receive your results only by: ?MyChart Message (if you have MyChart) OR ?A paper copy in the mail ?If you have any lab test that is abnormal or we need to change your treatment, we will call you to review the results. ? ?Testing/Procedures: ?Your physician has requested that you have a lexiscan myoview. For further information please visit [email or web address] Please follow instruction sheet, as given. ? ?Follow-Up: ?At [facility], you and your health needs are our priority.  As part of our continuing mission to provide you with exceptional heart care, we have created designated Provider Care Teams.  These Care Teams include your primary Cardiologist (physician) and Advanced Practice Providers (APPs -  Physician Assistants and Nurse Practitioners) who all work together to provide you with the care you need, when you need it. ? ?We recommend signing up for the patient portal called "MyChart".  Sign up information is provided on this After Visit Summary.  MyChart is used to connect with patients for Virtual Visits (Telemedicine).  Patients are able to view lab/test results, encounter notes, upcoming appointments, etc.  Non-urgent messages can be sent to your provider as well.   ?To learn more about what you can do with MyChart, go to [email or web address].   ? ?Your next appointment:   ?As needed ? ?The format for your next appointment:   ?In Person ? ?Provider:   ?Donato Schultz, MD { ? ?

## 2021-08-01 ENCOUNTER — Telehealth (HOSPITAL_COMMUNITY): Payer: Self-pay | Admitting: *Deleted

## 2021-08-01 NOTE — Telephone Encounter (Signed)
Patient given detailed instructions per Myocardial Perfusion Study Information Sheet for the test on 08/08/2021 at 8:15. Patient notified to arrive 15 minutes early and that it is imperative to arrive on time for appointment to keep from having the test rescheduled. ? If you need to cancel or reschedule your appointment, please call the office within 24 hours of your appointment. . Patient verbalized understanding.Karina Lynch ? ? ? ?

## 2021-08-04 ENCOUNTER — Telehealth: Payer: Self-pay | Admitting: Cardiology

## 2021-08-04 NOTE — Telephone Encounter (Signed)
New Message: ? ? ? ? ?Patient said she saw Dr Anne Fu on last Wednesday(07-27-21). She said he said it was alright for her to take medicine for her ADHD. She needs need him to sent tthis note to Donnie Aho stating that it is ok for her to take the medicine. Please fax to [phone].  ?

## 2021-08-04 NOTE — Telephone Encounter (Signed)
Pt aware Dr Anne Fu' OV note has been faxed to Donnie Aho as requested. ?

## 2021-08-08 ENCOUNTER — Ambulatory Visit (HOSPITAL_COMMUNITY): Payer: Federal, State, Local not specified - PPO | Attending: Cardiovascular Disease

## 2021-08-08 DIAGNOSIS — F909 Attention-deficit hyperactivity disorder, unspecified type: Secondary | ICD-10-CM | POA: Diagnosis not present

## 2021-08-08 DIAGNOSIS — I10 Essential (primary) hypertension: Secondary | ICD-10-CM | POA: Insufficient documentation

## 2021-08-08 DIAGNOSIS — R0609 Other forms of dyspnea: Secondary | ICD-10-CM | POA: Diagnosis not present

## 2021-08-08 LAB — MYOCARDIAL PERFUSION IMAGING
LV dias vol: 56 mL (ref 46–106)
LV sys vol: 15 mL
Nuc Stress EF: 73 %
Peak HR: 109 {beats}/min
Rest HR: 85 {beats}/min
Rest Nuclear Isotope Dose: 11 mCi
SDS: 6
SRS: 0
SSS: 6
ST Depression (mm): 0 mm
Stress Nuclear Isotope Dose: 31.8 mCi
TID: 0.95

## 2021-08-08 MED ORDER — TECHNETIUM TC 99M TETROFOSMIN IV KIT
31.8000 | PACK | Freq: Once | INTRAVENOUS | Status: AC | PRN
  Administered 2021-08-08: 31.8 via INTRAVENOUS
  Filled 2021-08-08: qty 32

## 2021-08-08 MED ORDER — REGADENOSON 0.4 MG/5ML IV SOLN
0.4000 mg | Freq: Once | INTRAVENOUS | Status: AC
  Administered 2021-08-08: 0.4 mg via INTRAVENOUS

## 2021-08-08 MED ORDER — TECHNETIUM TC 99M TETROFOSMIN IV KIT
11.0000 | PACK | Freq: Once | INTRAVENOUS | Status: AC | PRN
  Administered 2021-08-08: 11 via INTRAVENOUS
  Filled 2021-08-08: qty 11

## 2021-08-23 DIAGNOSIS — G4700 Insomnia, unspecified: Secondary | ICD-10-CM | POA: Diagnosis not present

## 2021-08-23 DIAGNOSIS — F902 Attention-deficit hyperactivity disorder, combined type: Secondary | ICD-10-CM | POA: Diagnosis not present

## 2021-08-23 DIAGNOSIS — F4010 Social phobia, unspecified: Secondary | ICD-10-CM | POA: Diagnosis not present

## 2021-08-31 ENCOUNTER — Telehealth: Payer: Self-pay | Admitting: Cardiology

## 2021-08-31 NOTE — Telephone Encounter (Signed)
Patient is requesting to have a fax sent to her caregiver, Donnie Aho, stating that everything was alright with her stress test if possible. ? ?Fax#: [phone] ?

## 2021-08-31 NOTE — Telephone Encounter (Signed)
Stress test results faxed as requested. ?

## 2021-09-01 NOTE — Telephone Encounter (Signed)
Left message advising test results had been sent. ?

## 2021-09-08 ENCOUNTER — Ambulatory Visit: Payer: Federal, State, Local not specified - PPO | Admitting: Nurse Practitioner

## 2021-11-01 ENCOUNTER — Encounter (HOSPITAL_COMMUNITY): Payer: Self-pay

## 2021-11-01 ENCOUNTER — Encounter (HOSPITAL_COMMUNITY): Payer: Self-pay | Admitting: Emergency Medicine

## 2021-11-01 ENCOUNTER — Inpatient Hospital Stay (HOSPITAL_COMMUNITY)
Admission: EM | Admit: 2021-11-01 | Discharge: 2021-11-05 | DRG: 641 | Disposition: A | Discharge status: Home / Self Care | Payer: Federal, State, Local not specified - PPO | Attending: Internal Medicine | Admitting: Internal Medicine

## 2021-11-01 ENCOUNTER — Ambulatory Visit (HOSPITAL_COMMUNITY): Payer: Self-pay

## 2021-11-01 ENCOUNTER — Other Ambulatory Visit: Payer: Self-pay

## 2021-11-01 ENCOUNTER — Ambulatory Visit (HOSPITAL_COMMUNITY)
Admission: EM | Admit: 2021-11-01 | Discharge: 2021-11-01 | Disposition: A | Discharge status: Home / Self Care | Payer: Federal, State, Local not specified - PPO

## 2021-11-01 ENCOUNTER — Emergency Department (HOSPITAL_COMMUNITY): Payer: Federal, State, Local not specified - PPO

## 2021-11-01 DIAGNOSIS — D75839 Thrombocytosis, unspecified: Secondary | ICD-10-CM | POA: Diagnosis not present

## 2021-11-01 DIAGNOSIS — Z833 Family history of diabetes mellitus: Secondary | ICD-10-CM

## 2021-11-01 DIAGNOSIS — R112 Nausea with vomiting, unspecified: Secondary | ICD-10-CM | POA: Diagnosis not present

## 2021-11-01 DIAGNOSIS — E119 Type 2 diabetes mellitus without complications: Secondary | ICD-10-CM

## 2021-11-01 DIAGNOSIS — K219 Gastro-esophageal reflux disease without esophagitis: Secondary | ICD-10-CM | POA: Diagnosis not present

## 2021-11-01 DIAGNOSIS — I10 Essential (primary) hypertension: Secondary | ICD-10-CM | POA: Diagnosis not present

## 2021-11-01 DIAGNOSIS — R9431 Abnormal electrocardiogram [ECG] [EKG]: Secondary | ICD-10-CM | POA: Diagnosis not present

## 2021-11-01 DIAGNOSIS — J302 Other seasonal allergic rhinitis: Secondary | ICD-10-CM | POA: Diagnosis present

## 2021-11-01 DIAGNOSIS — D509 Iron deficiency anemia, unspecified: Secondary | ICD-10-CM | POA: Diagnosis not present

## 2021-11-01 DIAGNOSIS — E871 Hypo-osmolality and hyponatremia: Principal | ICD-10-CM | POA: Diagnosis present

## 2021-11-01 DIAGNOSIS — R4781 Slurred speech: Secondary | ICD-10-CM | POA: Diagnosis not present

## 2021-11-01 DIAGNOSIS — Z20822 Contact with and (suspected) exposure to covid-19: Secondary | ICD-10-CM | POA: Diagnosis not present

## 2021-11-01 DIAGNOSIS — A084 Viral intestinal infection, unspecified: Secondary | ICD-10-CM | POA: Diagnosis not present

## 2021-11-01 DIAGNOSIS — Z6834 Body mass index (BMI) 34.0-34.9, adult: Secondary | ICD-10-CM

## 2021-11-01 DIAGNOSIS — R8281 Pyuria: Secondary | ICD-10-CM | POA: Diagnosis present

## 2021-11-01 DIAGNOSIS — E876 Hypokalemia: Secondary | ICD-10-CM | POA: Diagnosis not present

## 2021-11-01 DIAGNOSIS — R5383 Other fatigue: Secondary | ICD-10-CM | POA: Diagnosis not present

## 2021-11-01 DIAGNOSIS — F909 Attention-deficit hyperactivity disorder, unspecified type: Secondary | ICD-10-CM | POA: Diagnosis present

## 2021-11-01 DIAGNOSIS — Z79899 Other long term (current) drug therapy: Secondary | ICD-10-CM

## 2021-11-01 DIAGNOSIS — R053 Chronic cough: Secondary | ICD-10-CM | POA: Diagnosis present

## 2021-11-01 DIAGNOSIS — Z7984 Long term (current) use of oral hypoglycemic drugs: Secondary | ICD-10-CM

## 2021-11-01 DIAGNOSIS — R059 Cough, unspecified: Secondary | ICD-10-CM | POA: Diagnosis not present

## 2021-11-01 DIAGNOSIS — R627 Adult failure to thrive: Secondary | ICD-10-CM | POA: Diagnosis present

## 2021-11-01 DIAGNOSIS — E861 Hypovolemia: Secondary | ICD-10-CM | POA: Diagnosis present

## 2021-11-01 DIAGNOSIS — E860 Dehydration: Secondary | ICD-10-CM

## 2021-11-01 DIAGNOSIS — R197 Diarrhea, unspecified: Secondary | ICD-10-CM | POA: Diagnosis not present

## 2021-11-01 HISTORY — DX: Type 2 diabetes mellitus without complications: E11.9

## 2021-11-01 LAB — URINALYSIS, ROUTINE W REFLEX MICROSCOPIC
Bilirubin Urine: NEGATIVE
Glucose, UA: NEGATIVE mg/dL
Ketones, ur: NEGATIVE mg/dL
Nitrite: NEGATIVE
Protein, ur: NEGATIVE mg/dL
Specific Gravity, Urine: 1.003 — ABNORMAL LOW (ref 1.005–1.030)
pH: 6 (ref 5.0–8.0)

## 2021-11-01 LAB — COMPREHENSIVE METABOLIC PANEL
ALT: 12 U/L (ref 0–44)
AST: 23 U/L (ref 15–41)
Albumin: 3.8 g/dL (ref 3.5–5.0)
Alkaline Phosphatase: 96 U/L (ref 38–126)
Anion gap: 14 (ref 5–15)
BUN: 11 mg/dL (ref 8–23)
CO2: 24 mmol/L (ref 22–32)
Calcium: 8.5 mg/dL — ABNORMAL LOW (ref 8.9–10.3)
Chloride: 72 mmol/L — ABNORMAL LOW (ref 98–111)
Creatinine, Ser: 0.83 mg/dL (ref 0.44–1.00)
GFR, Estimated: >60 mL/min (ref >60)
Glucose, Bld: 143 mg/dL — ABNORMAL HIGH (ref 70–99)
Potassium: 3 mmol/L — ABNORMAL LOW (ref 3.5–5.1)
Sodium: 110 mmol/L — CL (ref 135–145)
Total Bilirubin: 1.3 mg/dL — ABNORMAL HIGH (ref 0.3–1.2)
Total Protein: 7.2 g/dL (ref 6.5–8.1)

## 2021-11-01 LAB — CBC
HCT: 35.2 % — ABNORMAL LOW (ref 36.0–46.0)
HCT: 33.7 % — ABNORMAL LOW (ref 36.0–46.0)
Hemoglobin: 13 g/dL (ref 12.0–15.0)
Hemoglobin: 12.5 g/dL (ref 12.0–15.0)
MCH: 27.1 pg (ref 26.0–34.0)
MCH: 27.5 pg (ref 26.0–34.0)
MCHC: 36.9 g/dL — ABNORMAL HIGH (ref 30.0–36.0)
MCHC: 37.1 g/dL — ABNORMAL HIGH (ref 30.0–36.0)
MCV: 73.3 fL — ABNORMAL LOW (ref 80.0–100.0)
MCV: 74.2 fL — ABNORMAL LOW (ref 80.0–100.0)
Platelets: 523 10*3/uL — ABNORMAL HIGH (ref 150–400)
Platelets: 446 10*3/uL — ABNORMAL HIGH (ref 150–400)
RBC: 4.8 MIL/uL (ref 3.87–5.11)
RBC: 4.54 MIL/uL (ref 3.87–5.11)
RDW: 12.5 % (ref 11.5–15.5)
RDW: 12.6 % (ref 11.5–15.5)
WBC: 15.9 10*3/uL — ABNORMAL HIGH (ref 4.0–10.5)
WBC: 16.6 10*3/uL — ABNORMAL HIGH (ref 4.0–10.5)
nRBC: 0 % (ref 0.0–0.2)
nRBC: 0 % (ref 0.0–0.2)

## 2021-11-01 LAB — BASIC METABOLIC PANEL
Anion gap: 10 (ref 5–15)
BUN: 11 mg/dL (ref 8–23)
CO2: 25 mmol/L (ref 22–32)
Calcium: 8.2 mg/dL — ABNORMAL LOW (ref 8.9–10.3)
Chloride: 78 mmol/L — ABNORMAL LOW (ref 98–111)
Creatinine, Ser: 0.77 mg/dL (ref 0.44–1.00)
GFR, Estimated: >60 mL/min (ref >60)
Glucose, Bld: 124 mg/dL — ABNORMAL HIGH (ref 70–99)
Potassium: 3.8 mmol/L (ref 3.5–5.1)
Sodium: 113 mmol/L — CL (ref 135–145)

## 2021-11-01 LAB — SODIUM
Sodium: 117 mmol/L — CL (ref 135–145)
Sodium: 120 mmol/L — ABNORMAL LOW (ref 135–145)

## 2021-11-01 LAB — SODIUM, URINE, RANDOM: Sodium, Ur: <10 mmol/L

## 2021-11-01 LAB — GLUCOSE, CAPILLARY: Glucose-Capillary: 116 mg/dL — ABNORMAL HIGH (ref 70–99)

## 2021-11-01 LAB — LIPASE, BLOOD: Lipase: 44 U/L (ref 11–51)

## 2021-11-01 LAB — MRSA NEXT GEN BY PCR, NASAL: MRSA by PCR Next Gen: NOT DETECTED

## 2021-11-01 LAB — OSMOLALITY: Osmolality: 236 mOsm/kg — CL (ref 275–295)

## 2021-11-01 LAB — RESP PANEL BY RT-PCR (FLU A&B, COVID) ARPGX2
Influenza A by PCR: NEGATIVE
Influenza B by PCR: NEGATIVE
SARS Coronavirus 2 by RT PCR: NEGATIVE

## 2021-11-01 MED ORDER — LOSARTAN POTASSIUM 50 MG PO TABS: 100.0000 mg | ORAL_TABLET | Freq: Every day | ORAL | Status: DC

## 2021-11-01 MED ORDER — LOPERAMIDE HCL 2 MG PO CAPS: 2.0000 mg | ORAL_CAPSULE | ORAL | Status: DC | PRN

## 2021-11-01 MED ORDER — DOCUSATE SODIUM 100 MG PO CAPS: 100.0000 mg | ORAL_CAPSULE | Freq: Two times a day (BID) | ORAL | Status: DC | PRN

## 2021-11-01 MED ORDER — CHLORHEXIDINE GLUCONATE CLOTH 2 % EX PADS
6.0000 | MEDICATED_PAD | Freq: Every day | CUTANEOUS | Status: DC
  Administered 2021-11-01 – 2021-11-03 (×2): 6 via TOPICAL

## 2021-11-01 MED ORDER — PANTOPRAZOLE SODIUM 40 MG PO TBEC
40.0000 mg | DELAYED_RELEASE_TABLET | Freq: Every day | ORAL | Status: DC
  Administered 2021-11-01 – 2021-11-05 (×5): 40 mg via ORAL
  Filled 2021-11-01 (×5): qty 1

## 2021-11-01 MED ORDER — POTASSIUM CHLORIDE 10 MEQ/100ML IV SOLN
10.0000 meq | INTRAVENOUS | Status: AC
  Administered 2021-11-01 (×2): 10 meq via INTRAVENOUS
  Filled 2021-11-01 (×2): qty 100

## 2021-11-01 MED ORDER — LORAZEPAM 2 MG/ML IJ SOLN
0.5000 mg | Freq: Four times a day (QID) | INTRAMUSCULAR | Status: DC | PRN
  Administered 2021-11-01 – 2021-11-04 (×3): 0.5 mg via INTRAVENOUS
  Filled 2021-11-01 (×3): qty 1

## 2021-11-01 MED ORDER — ENOXAPARIN SODIUM 40 MG/0.4ML IJ SOSY
40.0000 mg | PREFILLED_SYRINGE | INTRAMUSCULAR | Status: DC
  Administered 2021-11-01 – 2021-11-04 (×4): 40 mg via SUBCUTANEOUS
  Filled 2021-11-01 (×4): qty 0.4

## 2021-11-01 MED ORDER — POLYETHYLENE GLYCOL 3350 17 G PO PACK: 17.0000 g | PACK | Freq: Every day | ORAL | Status: DC | PRN

## 2021-11-01 MED ORDER — SODIUM CHLORIDE 0.9 % IV SOLN
1.0000 g | INTRAVENOUS | Status: DC
  Administered 2021-11-01 – 2021-11-02 (×2): 1 g via INTRAVENOUS
  Filled 2021-11-01 (×2): qty 10

## 2021-11-01 MED ORDER — SODIUM CHLORIDE 0.9 % IV BOLUS
1000.0000 mL | Freq: Once | INTRAVENOUS | Status: AC
  Administered 2021-11-01: 1000 mL via INTRAVENOUS

## 2021-11-01 MED ORDER — ENOXAPARIN SODIUM 40 MG/0.4ML IJ SOSY
40.0000 mg | PREFILLED_SYRINGE | INTRAMUSCULAR | Status: DC
Start: 2021-11-01 — End: 2021-11-01

## 2021-11-01 NOTE — ED Provider Notes (Signed)
[facility]    CSN: [id number] Arrival date & time: 11/01/21  0944      History   Chief Complaint Chief Complaint  Patient presents with   Fatigue   Diarrhea    HPI TAVIE HASEMAN is a 66 y.o. female.   67 year old female presents with nausea, vomiting, dizziness, 3 recent falls and slurred speech.  Patient relates her symptoms started on Sunday, and have continued, she is accompanied by her sister.  Patient relates that Sunday EMS was called to the scene, and she refused transport to ER for evaluation.  Patient is here in the triage area for evaluation, she continues with slurred speech, and weakness.  Patient has been advised that she needs to present to the emergency room for evaluation and work-up.  The patient's sister will transport her to the emergency room.   Diarrhea   Past Medical History:  Diagnosis Date   Diabetes mellitus without complication (HCC)     There are no problems to display for this patient.   History reviewed. No pertinent surgical history.  OB History   No obstetric history on file.      Home Medications    Prior to Admission medications   Medication Sig Start Date End Date Taking? Authorizing Provider  buPROPion (WELLBUTRIN XL) 300 MG 24 hr tablet Take 300 mg by mouth daily.   Yes [provider]  chlorthalidone (HYGROTON) 25 MG tablet Take 25 mg by mouth daily.   Yes [provider]  dexmethylphenidate (FOCALIN XR) 20 MG 24 hr capsule Take 40 mg by mouth daily.   Yes [provider]  losartan (COZAAR) 100 MG tablet Take 100 mg by mouth daily.   Yes [provider]  metFORMIN (GLUMETZA) 500 MG (MOD) 24 hr tablet Take 500 mg by mouth 2 (two) times daily with a meal.   Yes [provider]  methylphenidate (METADATE CD) 20 MG CR capsule Take 20 mg by mouth every morning.   Yes [provider]  pantoprazole (PROTONIX) 40 MG tablet Take 40 mg by mouth 2 (two) times daily.   Yes  [provider]    Family History No family history on file.  Social History     Allergies   Patient has no known allergies.   Review of Systems Review of Systems  Gastrointestinal:  Positive for diarrhea.     Physical Exam Triage Vital Signs ED Triage Vitals  Enc Vitals Group     BP 11/01/21 0950 124/67     Pulse Rate 11/01/21 0950 77     Resp 11/01/21 0950 18     Temp 11/01/21 0950 (!) 97.3 F (36.3 C)     Temp Source 11/01/21 0950 Oral     SpO2 11/01/21 0950 96 %     Weight --      Height --      Head Circumference --      Peak Flow --      Pain Score 11/01/21 0953 6     Pain Loc --      Pain Edu? --      Excl. in GC? --    No data found.  Updated Vital Signs BP 124/67 (BP Location: Right Arm)   Pulse 77   Temp (!) 97.3 F (36.3 C) (Oral)   Resp 18   SpO2 96%   Visual Acuity Right Eye Distance:   Left Eye Distance:   Bilateral Distance:    Right Eye Near:  Left Eye Near:    Bilateral Near:     Physical Exam   UC Treatments / Results  Labs (all labs ordered are listed, but only abnormal results are displayed) Labs Reviewed - No data to display  EKG   Radiology No results found.  Procedures Procedures (including critical care time)  Medications Ordered in UC Medications - No data to display  Initial Impression / Assessment and Plan / UC Course  I have reviewed the triage vital signs and the nursing notes.  Pertinent labs & imaging results that were available during my care of the patient were reviewed by me and considered in my medical decision making (see chart for details).    Plan: 1.  Patient has been referred to the emergency room for evaluation of her symptoms. Final Clinical Impressions(s) / UC Diagnoses   Final diagnoses:  Dehydration  Other fatigue  Slurred speech   Discharge Instructions   None    ED Prescriptions   None    PDMP not reviewed this encounter.   Ellsworth Lennox, PA-C 11/01/21  1010

## 2021-11-01 NOTE — H&P (Cosign Needed Addendum)
NAME:  Karina Lynch, MRN:  [id number], DOB:  10/14/1955, LOS: 0 ADMISSION DATE:  11/01/2021, Primary: Patient, No Pcp Per  CHIEF COMPLAINT:  generalized weakness   Medical Service: Internal Medicine Teaching Service         Attending Physician: Dr. Sid Falcon, MD    First Contact: Dr. Jodell Cipro Pager: [phone]  Second Contact: Dr. Coy Saunas Pager: [phone]       After Hours (After 5p/  First Contact Pager: [phone]  weekends / holidays): Second Contact Pager: [place]   Muska Meese is [phone] with type II diabetes mellitus, GERD, hypertension, and ADHD who is presenting to [facility] today for generalized weakness. Karina Lynch was in her usual state of health until ~10 days ago, when she started feeling unwell. Over the next week she reports experiencing significant nausea, vomiting, diarrhea, and loss of appetite. Mentions she has not felt like eating anything during this time and has only eaten small amounts each day. Describes the diarrhea as watery without blood, mucous, or large volume. Typically occurs a few times daily, denies associated abdominal pain or change in color of stool. Also denies any recent dietary changes, travel, or camping. This last Friday, her sister from the [place] area picked her up from her home in [place], [place] to come stay with her. Since that time she has experienced a few falls and feeling off-balance. She describes the falls as her "legs giving out" when getting out of bed or out of the bath tub. Denies any head trauma. In addition, over the last few days reports increasing "slow thinking." She also denies any fevers, chills, chest pain, dyspnea, abdominal pain, hematemesis, coffee-ground emesis, melena, dysuria, increased urinary frequency, confusion, seizures, dizziness, vision changes, or focal weaknesses.   PCP: Patient, No Pcp Per  ED COURSE   Patient arrived to [facility] hemodynamically stable sating well on room air. Lab work  revealed significant hyponatremia at 110 and IMTS was subsequently called for admission.   PAST MEDICAL HISTORY   She,  has a past medical history of ADD (attention deficit disorder), Atopic dermatitis, Congenital dilation of aortic arch, DOE (dyspnea on exertion), DOE (dyspnea on exertion), GERD (gastroesophageal reflux disease), HTN (hypertension), IFG (impaired fasting glucose), Neuropathy, and Seasonal allergies.   HOME MEDICATIONS   Prior to Admission medications   Medication Sig Start Date End Date Taking? Authorizing Provider  buPROPion (WELLBUTRIN XL) 300 MG 24 hr tablet Take 1 tablet by mouth daily. 07/24/21  Yes [provider]  chlorthalidone (HYGROTON) 25 MG tablet Take 25 mg by mouth daily. 05/03/21  Yes [provider]  Dexmethylphenidate HCl 40 MG CP24 Take 1 capsule by mouth every morning. 01/04/21  Yes [provider]  losartan (COZAAR) 100 MG tablet Take 100 mg by mouth daily. 06/03/21  Yes [provider]  metFORMIN (GLUCOPHAGE-XR) 500 MG 24 hr tablet Take 500 mg by mouth 2 (two) times daily. 10/30/21  Yes [provider]  methylphenidate (RITALIN) 20 MG tablet Take 20-40 mg by mouth daily as needed. 07/13/21  Yes [provider]  pantoprazole (PROTONIX) 40 MG tablet Take 40 mg by mouth daily. 06/01/21  Yes [provider]   ALLERGIES   Allergies as of 11/01/2021   (No Known Allergies)   SOCIAL HISTORY   Karina Lynch lives by herself at her home in [place], [place]. 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 Prior to this  episode, she was able to independently complete all of her ADL's and IADL's. Denies tobacco use or recreational drug history. She previously socially drank alcohol, but no longer does.  FAMILY HISTORY   Her family history includes Cancer in her father and mother; Diabetes in her father.   REVIEW OF SYSTEMS   ROS per history of present  illness.  PHYSICAL EXAMINATION   Blood pressure 134/73, pulse 68, temperature 97.7 F (36.5 C), temperature source Oral, resp. rate 16, height 5' 6.5" (1.689 m), weight 97.5 kg, SpO2 97 %.    Filed Weights   11/01/21 1023  Weight: 97.5 kg   GENERAL: Well-kept, comfortable-appearing person laying in bed in no acute distress HENT: Normocephalic, atraumatic. Neck supple. Dry mucous membranes. EYES: No scleral icterus or conjunctival injection. Vision grossly in tact. CV: Regular rate, rhythm. No murmurs appreciated. Distal pulses 2+ bilaterally. No JVD. PULM: Normal respiratory effort on room air. Clear to ausculation bilaterally. GI: Abdomen soft, non-tender, non-distended. Normoactive bowel sounds. MSK: Normal bulk, tone. No pitting edema bilateral lower extremities.  SKIN: Warm, dry. Two yellow-tinted bruises on L flank and L upper back. No rashes or lesions. Poor skin turgor.  NEURO: Awake, alert, conversing appropriately. Cranial nerves grossly in tact. Motor function 4/5 bilateral lower extremities, 5/5 bilateral upper extremities. Sensation in tact throughout. PSYCH: Normal mood, affect, speech.  SIGNIFICANT DIAGNOSTIC TESTS   ECG: Normal sinus rhythm. Prolonged QT. Q waves in I, II appear to be chronic.   I personally reviewed patient's ECG with my interpretation as above.  CXR: No effusions or consolidations appreciated. Some hazy opacities bilateral lower lung fields, likely not acute process.   I personally reviewed patient's CXR with my interpretation as above.  LABS      Latest Ref Rng & Units 11/01/2021   10:42 AM 06/03/2012    5:01 PM  CBC  WBC 4.0 - 10.5 K/uL 15.9  14.7   Hemoglobin 12.0 - 15.0 g/dL 94.7  65.4   Hematocrit 36.0 - 46.0 % 35.2  32.1   Platelets 150 - 400 K/uL 523  275       Latest Ref Rng & Units 11/01/2021   10:42 AM  BMP  Glucose 70 - 99 mg/dL 650   BUN 8 - 23 mg/dL 11   Creatinine 3.54 - 1.00 mg/dL 6.56   Sodium 812 - 751 mmol/L 110    Potassium 3.5 - 5.1 mmol/L 3.0   Chloride 98 - 111 mmol/L 72   CO2 22 - 32 mmol/L 24   Calcium 8.9 - 10.3 mg/dL 8.5     CONSULTS   PCCM  ASSESSMENT   Karina Lynch is 66yo with type II diabetes mellitus, GERD, hypertension, and ADHD admitted 7/4 with severe hyponatremia.   PLAN   Principal Problem:   Hyponatremia  #Severe hypovolemic hyponatremia Patient presenting after 10d of GI losses with minimal PO intake. In addition, she is chronically on a thiazide diuretic. On examination, patient does appear to be hypovolemic and is neurologically in tact. She is hemodynamically stable, sating well on room air. However, with her sodium of 110 I am concerned that she will need a higher level of care at least acutely for monitoring. She received 1L LR, will plan to repeat this now. I have discussed with PCCM regarding taking over her care given she will need to have close monitoring. We will follow-up with them once they have evaluated. - Follow-up PCCM recommendations - Follow-up urine studies, osmolality - STAT BMP, will  decide fluid status afterward - Goal Na <118 in next 24h - Frequent neuro checks - Hold home diuretic - BMP q2H - NPO  #Viral gastroenteritis #Prolonged QT Patient's history is most consistent with viral gastroenteritis given her significant nausea and diarrhea over the last week. She has not had any high fevers, recent antibiotics, or blood in stool concerning for other disease processes. Would expect this to resolve in the upcoming days. For nausea medication, would avoid any QT prolonging medications, could try scopolamine or benzodiazepine if needed.  - Supportive care - Avoid QT-prolonging anti-emetics - Imodium as needed  #Hypertension Karina Lynch has been normotensive since arrival. Will hold her antihypertensives in setting of acute illness. - Hold home antihypertensives   #Leukocytosis #Thrombocytosis Patient arrived today with both significant  leukocytosis and thrombocytosis, possibly has some hemoconcentration from significant water losses? No differential placed on arrival, will get this tomorrow AM. Per chart review, she has a minor chronic thrombocytosis, unclear etiology. In addition, she has significantly low MCV despite Hgb 13. I will check iron studies as well. - Repeat CBC with diff in AM - Follow-up iron studies  #Hypokalemia K 3.0, most likely from significant GI losses. This is being repleted this afternoon, will repeat BMP. - Follow-up repeat BMP  #Type II diabetes mellitus Last A1c 6.4% three months ago. Patient reports she was started on metformin around that time. Will hold this in setting of her recent GI illness. Glucose at goal on arrival. Since she is not eating right now, will hold off any insulin coverage.  - Follow-up repeat A1c  #GERD #Chronic cough This is a chronic, stable issue for Karina Lynch. Reports chronic cough (which is unchanged) was previously determined to be from GERD. Will continue home medication. - Pantoprazole 40mg  daily  #Physical deconditioning Karina Lynch is extremely weak following this recent illness, even having two recent falls without significant injury. We will hold off therapy in the acute setting given the severity of her illness, but will need to work with PT/OT prior to discharge. - PT/OT   BEST PRACTICE   DIET: NPO IVF: n/a DVT PPX: lovenox BOWEL: senokot-s CODE: FULL FAM COM: Sister and daughter updated at bedside  DISPO: Admit patient to Inpatient with expected length of stay greater than 2 midnights.  San Morelle, MD Internal Medicine Resident PGY-3 Pager [phone] 11/01/2021 1:37 PM

## 2021-11-01 NOTE — Consult Note (Signed)
NAME:  Karina Lynch, MRN:  [id number], DOB:  22-May-1955, LOS: 0 ADMISSION DATE:  11/01/2021, CONSULTATION DATE:  11/01/21 REFERRING MD: IM TS-Mullen, CHIEF COMPLAINT: Nausea  History of Present Illness:  66 year old woman brought to the emergency room by daughter after 10 days of nausea, poor p.o. intake, diarrhea found to have severe hyponatremia.  She reports 10 days of nausea.  Poor appetite.  Gets nauseated with minimal food intake.  Denies any vomiting.  Intermittent diarrhea.  Sometimes uncontrollable, with accidents.  Again ongoing for 10 days.  Lives in [place].  Came to [place] at [employer] of her sister so they could help take care of her.  She had a couple of falls.  She describes a sensation of lightheadedness when standing up from seated position.  Given ongoing symptoms without improvement, brought to ED by daughter.  Normotensive.  Afebrile.  Labs notable for severe hyponatremia sodium 110.  Creatinine does not appear elevated.  Osmolality low at 236.  Urine sodium less than 10.  UA with leukocytes, mild pyuria with 11-20 WBCs per high-power field.  Pertinent  Medical History  ADHD, hypertension  Significant Hospital Events: Including procedures, antibiotic start and stop dates in addition to other pertinent events   7/4 brought to the hospital failure to thrive, 10 days of nausea, diarrhea, severe hyponatremia due to hypovolemia  Interim History / Subjective:  As above  Objective   Blood pressure 131/82, pulse 71, temperature 97.7 F (36.5 C), temperature source Oral, resp. rate 16, height 5' 6.5" (1.689 m), weight 97.5 kg, SpO2 95 %.       No intake or output data in the 24 hours ending 11/01/21 1747 Filed Weights   11/01/21 1023  Weight: 97.5 kg    Examination: General: Ill-appearing, lying in bed HENT: Dry mucous membranes, normal traumatic, acephalic Lungs: Clear, normal work of breathing on room air Cardiovascular: Regular rate and rhythm, no  murmur Abdomen: Nontender, bowel sounds present Extremities: No edema, warm Neuro: Follows commands, no focal deficits  Resolved Hospital Problem list     Assessment & Plan:  Hyponatremia: Suspect chronic in nature over the last 10+ days with poor p.o. intake, nausea, diarrhea.  Hypovolemic hyponatremia.  Hypoosmolar.  Urine sodium less than 10.  All points to hypovolemia as cause.  Possibly exacerbated by chlorthalidone. --Discontinue fluids, sodium has risen 110 the 113 on first recheck --Every 4 hour sodiums, ideally increase by 6 and 24-hour period, goal not to exceed 10 in 24 hours --Encourage p.o. intake as able  Nausea,  diarrhea: Suspect viral illness. --Check C. Difficile --Consider abdominal imaging if not improving, nontender on exam --Low-dose IV Ativan for nausea, QTc prolonged at 560 on admission  History of hypertension: Hold chlorthalidone indefinitely.  Consider resuming home losartan in the future pending blood pressures.  ADHD: Holding home medicines at this time.  Abnormal UA: Leukocytes, pyuria.  Relatively asymptomatic but possible nausea vomiting related to cystitis. --Ceftriaxone 1 g daily  GERD: Continue home PPI.  Best Practice (right click and "Reselect all SmartList Selections" daily)   Diet/type: clear liquids DVT prophylaxis: LMWH GI prophylaxis: N/A Lines: N/A Foley:  N/A Code Status:  full code Last date of multidisciplinary goals of care discussion [n/a]  Labs   CBC: Recent Labs  Lab 11/01/21 1042 11/01/21 1411  WBC 15.9* 16.6*  HGB 13.0 12.5  HCT 35.2* 33.7*  MCV 73.3* 74.2*  PLT 523* 446*    Basic Metabolic Panel: Recent Labs  Lab 11/01/21 1042  11/01/21 1411  NA 110* 113*  K 3.0* 3.8  CL 72* 78*  CO2 24 25  GLUCOSE 143* 124*  BUN 11 11  CREATININE 0.83 0.77  CALCIUM 8.5* 8.2*   GFR: Estimated Creatinine Clearance: 83.3 mL/min (by C-G formula based on SCr of 0.77 mg/dL). Recent Labs  Lab 11/01/21 1042  11/01/21 1411  WBC 15.9* 16.6*    Liver Function Tests: Recent Labs  Lab 11/01/21 1042  AST 23  ALT 12  ALKPHOS 96  BILITOT 1.3*  PROT 7.2  ALBUMIN 3.8   Recent Labs  Lab 11/01/21 1042  LIPASE 44   No results for input(s): "AMMONIA" in the last 168 hours.  ABG No results found for: "PHART", "PCO2ART", "PO2ART", "HCO3", "TCO2", "ACIDBASEDEF", "O2SAT"   Coagulation Profile: No results for input(s): "INR", "PROTIME" in the last 168 hours.  Cardiac Enzymes: No results for input(s): "CKTOTAL", "CKMB", "CKMBINDEX", "TROPONINI" in the last 168 hours.  HbA1C: No results found for: "HGBA1C"  CBG: Recent Labs  Lab 11/01/21 1717  GLUCAP 116*    Review of Systems:   No chest pain.  No headache.  No altered sensorium.  No chest pain.  No shortness of breath.  Comprehensive review of systems otherwise negative.  Past Medical History:  She,  has a past medical history of ADD (attention deficit disorder), Atopic dermatitis, Congenital dilation of aortic arch, DOE (dyspnea on exertion), DOE (dyspnea on exertion), GERD (gastroesophageal reflux disease), HTN (hypertension), IFG (impaired fasting glucose), Neuropathy, and Seasonal allergies.   Surgical History:   Past Surgical History:  Procedure Laterality Date   orif left 5th finger     righr ulnar nerve tumor excision       Social History:   reports that she has never smoked. She does not have any smokeless tobacco history on file. She reports current alcohol use. She reports that she does not use drugs.   Family History:  Her family history includes Cancer in her father and mother; Diabetes in her father.   Allergies No Known Allergies   Home Medications  Prior to Admission medications   Medication Sig Start Date End Date Taking? Authorizing Provider  buPROPion (WELLBUTRIN XL) 300 MG 24 hr tablet Take 1 tablet by mouth daily. 07/24/21  Yes [provider]  chlorthalidone (HYGROTON) 25 MG tablet Take 25 mg  by mouth daily. 05/03/21  Yes [provider]  Dexmethylphenidate HCl 40 MG CP24 Take 1 capsule by mouth every morning. 01/04/21  Yes [provider]  losartan (COZAAR) 100 MG tablet Take 100 mg by mouth daily. 06/03/21  Yes [provider]  metFORMIN (GLUCOPHAGE-XR) 500 MG 24 hr tablet Take 500 mg by mouth 2 (two) times daily. 10/30/21  Yes [provider]  methylphenidate (RITALIN) 20 MG tablet Take 20-40 mg by mouth daily as needed. 07/13/21  Yes [provider]  pantoprazole (PROTONIX) 40 MG tablet Take 40 mg by mouth daily. 06/01/21  Yes [provider]     Critical care time:    CRITICAL CARE Performed by: Karren Burly   Total critical care time: 40 minutes  Critical care time was exclusive of separately billable procedures and treating other patients.  Critical care was necessary to treat or prevent imminent or life-threatening deterioration.  Critical care was time spent personally by me on the following activities: development of treatment plan with patient and/or surrogate as well as nursing, discussions with consultants, evaluation of patient's response to treatment, examination of patient, obtaining history  from patient or surrogate, ordering and performing treatments and interventions, ordering and review of laboratory studies, ordering and review of radiographic studies, pulse oximetry and re-evaluation of patient's condition.  Karren Burly, MD See Loretha Stapler for contact info

## 2021-11-01 NOTE — ED Notes (Signed)
Critical sodium 110. PA [facility] notified.  No new orders at this time.

## 2021-11-01 NOTE — ED Provider Notes (Signed)
[facility] EMERGENCY DEPARTMENT Provider Note   CSN: [id number] Arrival date & time: 11/01/21  1011     History  Chief Complaint  Patient presents with   Diarrhea    Karina Lynch is a 66 y.o. female with a past medical history of GERD, hypertension.  Presents to the emergency department for complaint of generalized weakness, nausea, vomiting, diarrhea, and cough.  Patient reports that she has been dealing with nausea, vomiting, and diarrhea over the last 10 days.  States that she is not vomiting and having diarrhea every day but has had it multiple days over the last 10 days.  Patient reports marked decrease in p.o. intake over the last 10 days due to her symptoms.  Patient also reports a sore throat and cough over the last 10 days.  Cough is nonproductive.  Patient has been vaccinated for COVID-19 and received boosters.  Patient denies any known sick contacts  Patient reports generalized weakness over the last 10 days as well.  Patient reports that she has had 3 falls over the last 10 days secondary to her weakness or tripping over objects.  Patient denies hitting her head or any loss of consciousness.  No preceding chest pain, shortness of breath, sudden onset of headache, lightheadedness, dizziness, or visual disturbance leading to patient's fall.  Patient denies any fever, chills, trouble swallowing, shortness of breath, hemoptysis, abdominal pain, hematemesis, coffee-ground emesis, melena, blood in stool, constipation, dysuria, hematuria, urinary urgency, urinary frequency, vaginal pain, vaginal bleeding, vaginal discharge, numbness, weakness, saddle anesthesia, bowel/bladder dysfunction.  Denies any history of abdominal surgery.  Patient is not on any blood thinners.  Denies any recent antibiotic use, camping, or international travel   Diarrhea Associated symptoms: vomiting   Associated symptoms: no abdominal pain, no chills, no fever and no headaches         Home Medications Prior to Admission medications   Medication Sig Start Date End Date Taking? Authorizing Provider  albuterol (VENTOLIN HFA) 108 (90 Base) MCG/ACT inhaler Inhale 1 puff into the lungs as needed. 06/01/21   [provider]  buPROPion (WELLBUTRIN XL) 300 MG 24 hr tablet Take 1 tablet by mouth daily. 07/24/21   [provider]  chlorthalidone (HYGROTON) 25 MG tablet Take 0.5 tablets by mouth daily. 05/03/21   [provider]  Dexmethylphenidate HCl 40 MG CP24 Take 1 capsule by mouth every morning. 01/04/21   [provider]  losartan (COZAAR) 100 MG tablet Take 100 mg by mouth daily. 06/03/21   [provider]  methylphenidate (RITALIN) 20 MG tablet Take 20-40 mg by mouth daily as needed. 07/13/21   [provider]  pantoprazole (PROTONIX) 40 MG tablet Take 40 mg by mouth daily. 06/01/21   [provider]      Allergies    Patient has no known allergies.    Review of Systems   Review of Systems  Constitutional:  Negative for chills and fever.  HENT:  Positive for sore throat. Negative for drooling, facial swelling and trouble swallowing.   Eyes:  Negative for visual disturbance.  Respiratory:  Positive for cough. Negative for shortness of breath.   Cardiovascular:  Negative for chest pain, palpitations and leg swelling.  Gastrointestinal:  Positive for diarrhea, nausea and vomiting. Negative for abdominal distention, abdominal pain, anal bleeding, blood in stool, constipation and rectal pain.  Genitourinary:  Positive for decreased urine volume. Negative for difficulty urinating, dysuria, flank pain, frequency, genital sores, hematuria, urgency, vaginal bleeding,  vaginal discharge and vaginal pain.  Musculoskeletal:  Negative for back pain and neck pain.  Skin:  Negative for color change and rash.  Neurological:  Positive for weakness. Negative for dizziness, tremors, seizures, syncope, facial asymmetry, speech difficulty,  light-headedness, numbness and headaches.  Psychiatric/Behavioral:  Negative for confusion.     Physical Exam Updated Vital Signs BP 132/73 (BP Location: Right Arm)   Pulse 73   Temp 97.7 F (36.5 C) (Oral)   Resp 18   Ht 5' 6.5" (1.689 m)   Wt 97.5 kg   SpO2 91%   BMI 34.17 kg/m  Physical Exam Vitals and nursing note reviewed.  Constitutional:      General: She is not in acute distress.    Appearance: She is not ill-appearing, toxic-appearing or diaphoretic.  HENT:     Head: Normocephalic and atraumatic. No raccoon eyes, Battle's sign, abrasion, contusion, masses, right periorbital erythema, left periorbital erythema or laceration.  Eyes:     General: No scleral icterus.       Right eye: No discharge.        Left eye: No discharge.     Extraocular Movements: Extraocular movements intact.     Conjunctiva/sclera: Conjunctivae normal.     Pupils: Pupils are equal, round, and reactive to light.  Cardiovascular:     Rate and Rhythm: Normal rate.  Pulmonary:     Effort: Pulmonary effort is normal. No tachypnea, bradypnea or respiratory distress.     Breath sounds: Normal breath sounds. No stridor.  Abdominal:     General: Abdomen is flat. Bowel sounds are normal. There is no distension. There are no signs of injury.     Palpations: Abdomen is soft. There is no mass or pulsatile mass.     Tenderness: There is no abdominal tenderness. There is no right CVA tenderness, left CVA tenderness, guarding or rebound.     Hernia: There is no hernia in the umbilical area or ventral area.  Musculoskeletal:     Comments: No midline tenderness or deformity to cervical, thoracic, or lumbar spine.  No tenderness, bony tenderness, or deformity to bilateral upper or lower extremities.  Patient moves all limbs equally without difficulty.  Skin:    General: Skin is warm and dry.  Neurological:     General: No focal deficit present.     Mental Status: She is alert and oriented to person, place,  and time.     GCS: GCS eye subscore is 4. GCS verbal subscore is 5. GCS motor subscore is 6.     Cranial Nerves: Cranial nerves 2-12 are intact. No cranial nerve deficit, dysarthria or facial asymmetry.     Comments: Sensation to light touch grossly intact to bilateral upper and lower extremities.  Patient moves all limbs equally without difficulty.  Psychiatric:        Behavior: Behavior is cooperative.     ED Results / Procedures / Treatments   Labs (all labs ordered are listed, but only abnormal results are displayed) Labs Reviewed  CBC - Abnormal; Notable for the following components:      Result Value   WBC 15.9 (*)    HCT 35.2 (*)    MCV 73.3 (*)    MCHC 36.9 (*)    Platelets 523 (*)    All other components within normal limits  LIPASE, BLOOD  COMPREHENSIVE METABOLIC PANEL  URINALYSIS, ROUTINE W REFLEX MICROSCOPIC    EKG None  Radiology No results found.  Procedures .Critical Care  Performed by: Haskel Schroeder, PA-C Authorized by: Haskel Schroeder, PA-C   Critical care provider statement:    Critical care time (minutes):  30   Critical care was necessary to treat or prevent imminent or life-threatening deterioration of the following conditions:  Metabolic crisis   Critical care was time spent personally by me on the following activities:  Development of treatment plan with patient or surrogate, discussions with consultants, evaluation of patient's response to treatment, examination of patient, ordering and review of laboratory studies, ordering and review of radiographic studies, ordering and performing treatments and interventions, pulse oximetry, re-evaluation of patient's condition and review of old charts   Care discussed with: admitting provider       Medications Ordered in ED Medications - No data to display  ED Course/ Medical Decision Making/ A&P Clinical Course as of 11/01/21 1521  Tue Nov 01, 2021  1223 Sodium(!!): 110 [PB]  1251 I spoke  to hospitalist with internal medicine team who will see the patient for admission. [PB]    Clinical Course User Index [PB] Haskel Schroeder, PA-C                           Medical Decision Making Amount and/or Complexity of Data Reviewed Labs: ordered. Decision-making details documented in ED Course. Radiology: ordered.  Risk Prescription drug management. Decision regarding hospitalization.   Alert 66 year old female in no acute distress, nontoxic-appearing.  Presents emerged department complaint of nausea, vomiting, diarrhea, and generalized weakness.  Information obtained from patient and patient's sister at bedside.  I reviewed patient's past medical records including previous provider notes, labs, and imaging.  Patient has medical history as outlined in HPI which complicates her care.  Due to reports of nausea, vomiting, diarrhea abdominal labs and CT abdomen/pelvis were ordered.  Additionally will add on chest x-ray and COVID/flu testing due to cough.  I personally viewed and interpreted patient's lab results.  Pertinent findings include: -Sodium of 110 -Potassium 3.0 -Leukocytosis 15.9 -Lipase within normal limits  Suspect that patient sodium is decreased secondary to dehydration and decreased p.o. intake.  We will give patient 1 L fluid bolus as well as check EKG, urine sodium, and osmolality.  Due to patient's hyponatremia she will need admission.  Additionally will give patient potassium for hypokalemia.  Patient care discussed with attending physician Dr. Rubin Payor.        Final Clinical Impression(s) / ED Diagnoses Final diagnoses:  Hyponatremia  Hypokalemia  Nausea vomiting and diarrhea    Rx / DC Orders ED Discharge Orders     None         Berneice Heinrich 11/01/21 1522    Benjiman Core, MD 11/01/21 [phone]

## 2021-11-01 NOTE — ED Triage Notes (Signed)
Pt c/o diarrhea, nausea, loss of appetite, weaknessx10 days. Pt states she's fallen 2-3 times the last couple of days. Pt denies blood thinners or hitting her head.

## 2021-11-01 NOTE — ED Notes (Signed)
Critical sodium 113.  MD Criselda Peaches notified.  No new orders at this time.

## 2021-11-01 NOTE — ED Notes (Signed)
Critical lab Serum Osmo 236.  MD Criselda Peaches notified.  No new orders at this time.

## 2021-11-01 NOTE — ED Triage Notes (Signed)
For over week pt had diarrhea, nausea and dry heaving, fatigue and weakness. Fell 3 times recently. Pt had EMS called out to her house by her PCP on Sunday due to her slurred speech on the phone when called their office about her symptoms but since vital signs were normal didn't go to hospital for further evaluation.   Pt reports some neck pains from laying down so much

## 2021-11-01 NOTE — ED Notes (Signed)
Patient is being discharged from the Urgent Care and sent to the Emergency Department via POV with sister . Per Ellsworth Lennox, PA, patient is in need of higher level of care due to slurred speech, falls, diarrhea. Patient is aware and verbalizes understanding of plan of care.  Vitals:   11/01/21 0950  BP: 124/67  Pulse: 77  Resp: 18  Temp: (!) 97.3 F (36.3 C)  SpO2: 96%

## 2021-11-02 DIAGNOSIS — E871 Hypo-osmolality and hyponatremia: Secondary | ICD-10-CM | POA: Diagnosis not present

## 2021-11-02 LAB — CBC WITH DIFFERENTIAL/PLATELET
Abs Immature Granulocytes: 0.27 10*3/uL — ABNORMAL HIGH (ref 0.00–0.07)
Basophils Absolute: 0 10*3/uL (ref 0.0–0.1)
Basophils Relative: 0 %
Eosinophils Absolute: 0.1 10*3/uL (ref 0.0–0.5)
Eosinophils Relative: 0 %
HCT: 34 % — ABNORMAL LOW (ref 36.0–46.0)
Hemoglobin: 12.3 g/dL (ref 12.0–15.0)
Immature Granulocytes: 2 %
Lymphocytes Relative: 13 %
Lymphs Abs: 1.9 10*3/uL (ref 0.7–4.0)
MCH: 26.8 pg (ref 26.0–34.0)
MCHC: 36.2 g/dL — ABNORMAL HIGH (ref 30.0–36.0)
MCV: 74.1 fL — ABNORMAL LOW (ref 80.0–100.0)
Monocytes Absolute: 1 10*3/uL (ref 0.1–1.0)
Monocytes Relative: 7 %
Neutro Abs: 11.8 10*3/uL — ABNORMAL HIGH (ref 1.7–7.7)
Neutrophils Relative %: 78 %
Platelets: 449 10*3/uL — ABNORMAL HIGH (ref 150–400)
RBC: 4.59 MIL/uL (ref 3.87–5.11)
RDW: 13.2 % (ref 11.5–15.5)
WBC: 15.1 10*3/uL — ABNORMAL HIGH (ref 4.0–10.5)
nRBC: 0 % (ref 0.0–0.2)

## 2021-11-02 LAB — C DIFFICILE QUICK SCREEN W PCR REFLEX
C Diff antigen: NEGATIVE
C Diff interpretation: NOT DETECTED
C Diff toxin: NEGATIVE

## 2021-11-02 LAB — IRON AND TIBC
Iron: 17 ug/dL — ABNORMAL LOW (ref 28–170)
Saturation Ratios: 4 % — ABNORMAL LOW (ref 10.4–31.8)
TIBC: 392 ug/dL (ref 250–450)
UIBC: 375 ug/dL

## 2021-11-02 LAB — MAGNESIUM: Magnesium: 2.6 mg/dL — ABNORMAL HIGH (ref 1.7–2.4)

## 2021-11-02 LAB — HIV ANTIBODY (ROUTINE TESTING W REFLEX): HIV Screen 4th Generation wRfx: NONREACTIVE

## 2021-11-02 LAB — FERRITIN: Ferritin: 26 ng/mL (ref 11–307)

## 2021-11-02 LAB — SODIUM
Sodium: 123 mmol/L — ABNORMAL LOW (ref 135–145)
Sodium: 123 mmol/L — ABNORMAL LOW (ref 135–145)
Sodium: 122 mmol/L — ABNORMAL LOW (ref 135–145)
Sodium: 122 mmol/L — ABNORMAL LOW (ref 135–145)

## 2021-11-02 LAB — BASIC METABOLIC PANEL
Anion gap: 12 (ref 5–15)
BUN: 7 mg/dL — ABNORMAL LOW (ref 8–23)
CO2: 26 mmol/L (ref 22–32)
Calcium: 8.5 mg/dL — ABNORMAL LOW (ref 8.9–10.3)
Chloride: 84 mmol/L — ABNORMAL LOW (ref 98–111)
Creatinine, Ser: 0.74 mg/dL (ref 0.44–1.00)
GFR, Estimated: >60 mL/min (ref >60)
Glucose, Bld: 118 mg/dL — ABNORMAL HIGH (ref 70–99)
Potassium: 2.9 mmol/L — ABNORMAL LOW (ref 3.5–5.1)
Sodium: 122 mmol/L — ABNORMAL LOW (ref 135–145)

## 2021-11-02 LAB — PHOSPHORUS: Phosphorus: 3.4 mg/dL (ref 2.5–4.6)

## 2021-11-02 LAB — HEMOGLOBIN A1C
Hgb A1c MFr Bld: 5.9 % — ABNORMAL HIGH (ref 4.8–5.6)
Mean Plasma Glucose: 122.63 mg/dL

## 2021-11-02 LAB — GLUCOSE, CAPILLARY: Glucose-Capillary: 148 mg/dL — ABNORMAL HIGH (ref 70–99)

## 2021-11-02 MED ORDER — SODIUM CHLORIDE 0.9 % IV SOLN: INTRAVENOUS | Status: DC | PRN

## 2021-11-02 MED ORDER — POTASSIUM CHLORIDE 20 MEQ PO PACK
40.0000 meq | PACK | ORAL | Status: AC
  Administered 2021-11-02 (×2): 40 meq via ORAL
  Filled 2021-11-02 (×2): qty 2

## 2021-11-02 MED ORDER — POTASSIUM CHLORIDE 10 MEQ/100ML IV SOLN
10.0000 meq | INTRAVENOUS | Status: AC
  Administered 2021-11-02 (×4): 10 meq via INTRAVENOUS
  Filled 2021-11-02 (×4): qty 100

## 2021-11-02 MED ORDER — DEXTROSE 5 % IV SOLN: INTRAVENOUS | Status: DC

## 2021-11-02 MED ORDER — DEXTROMETHORPHAN POLISTIREX ER 30 MG/5ML PO SUER
60.0000 mg | Freq: Two times a day (BID) | ORAL | Status: DC | PRN
Start: 2021-11-02 — End: 2021-11-05
  Administered 2021-11-02 – 2021-11-03 (×2): 60 mg via ORAL
  Filled 2021-11-02 (×3): qty 10

## 2021-11-02 NOTE — Progress Notes (Signed)
eLink Physician-Brief Progress Note Patient Name: Karina Lynch DOB: Sep 12, 1955 MRN: [id number]   Date of Service  11/02/2021  HPI/Events of Note  Reviewed Na levels which has increased from 110 --> 123 this morning.  Pt not on any fluids.   eICU Interventions  Start on D5W.  Follow serum Na q 4hrs.      Intervention Category Intermediate Interventions: Electrolyte abnormality - evaluation and management  Larinda Buttery 11/02/2021, 5:54 AM

## 2021-11-02 NOTE — Evaluation (Signed)
Physical Therapy Evaluation Patient Details Name: Karina Lynch MRN: [id number] DOB: 07/26/1955 Today's Date: 11/02/2021  History of Present Illness  pt is a 66 y/o female admitted to the [facility] 7/4 for generalized weakness.  work up included hyponatremia and suspected viral gastroenteritis.  PMHx: ADHD, HTN  Clinical Impression  Pt admitted with/for general weakness.  Pt needing min guard assist overall.   Pt currently limited functionally due to the problems listed below.  (see problems list.)  Pt will benefit from PT to maximize function and safety to be able to get home safely with available assist .        Recommendations for follow up therapy are one component of a multi-disciplinary discharge planning process, led by the attending physician.  Recommendations may be updated based on patient status, additional functional criteria and insurance authorization.  Follow Up Recommendations No PT follow up      Assistance Recommended at Discharge Set up Supervision/Assistance  Patient can return home with the following  Assistance with cooking/housework;Assist for transportation    Equipment Recommendations None recommended by PT  Recommendations for Other Services       Functional Status Assessment Patient has had a recent decline in their functional status and demonstrates the ability to make significant improvements in function in a reasonable and predictable amount of time.     Precautions / Restrictions Precautions Precautions: Fall      Mobility  Bed Mobility Overal bed mobility: Needs Assistance Bed Mobility: Supine to Sit     Supine to sit: Min guard     General bed mobility comments: reliant on UE's to scoot to EOB, rolled R and came up off R UE without assist, but needed extra time.    Transfers Overall transfer level: Needs assistance   Transfers: Sit to/from Stand Sit to Stand: Min guard           General transfer comment: min hard, tentative, reliant  on the AD (iv pole)    Ambulation/Gait Ambulation/Gait assistance: Min guard Gait Distance (Feet): 150 Feet Assistive device: IV Pole Gait Pattern/deviations: Step-through pattern   Gait velocity interpretation: <1.31 ft/sec, indicative of household ambulator   General Gait Details: episodes of mild unsteadiness, overall guarded with the IV pole for assist.  Stairs            Wheelchair Mobility    Modified Rankin (Stroke Patients Only)       Balance Overall balance assessment: Needs assistance Sitting-balance support: Single extremity supported, No upper extremity supported, Feet supported Sitting balance-Leahy Scale: Fair     Standing balance support: Single extremity supported, During functional activity, No upper extremity supported Standing balance-Leahy Scale: Fair Standing balance comment: prefers holding to AD                             Pertinent Vitals/Pain Pain Assessment Pain Assessment: No/denies pain    Home Living Family/patient expects to be discharged to:: Private residence Living Arrangements: Alone Available Help at Discharge: Other (Comment) (hopefully can stay with sister locally before heading back to [place] area and work.) Type of Home: Apartment Home Access: Stairs to enter Entrance Stairs-Rails: Doctor, general practice of Steps: flight   Home Layout: One level Home Equipment: None      Prior Function Prior Level of Function : Independent/Modified Independent;Working/employed;Driving                     Hand Dominance  Extremity/Trunk Assessment   Upper Extremity Assessment Upper Extremity Assessment: Overall WFL for tasks assessed;Generalized weakness    Lower Extremity Assessment Lower Extremity Assessment: Overall WFL for tasks assessed;Generalized weakness       Communication   Communication: No difficulties  Cognition Arousal/Alertness: Awake/alert Behavior During Therapy:  WFL for tasks assessed/performed Overall Cognitive Status: Within Functional Limits for tasks assessed                                          General Comments General comments (skin integrity, edema, etc.): VSS on RA    Exercises     Assessment/Plan    PT Assessment Patient needs continued PT services  PT Problem List Decreased strength;Decreased activity tolerance;Decreased balance;Decreased mobility;Decreased knowledge of use of DME       PT Treatment Interventions Gait training;Stair training;Functional mobility training;Therapeutic activities;Balance training;Patient/family education    PT Goals (Current goals can be found in the Care Plan section)  Acute Rehab PT Goals Patient Stated Goal: back home and back to work PT Goal Formulation: With patient Time For Goal Achievement: 11/16/21 Potential to Achieve Goals: Good    Frequency Min 3X/week     Co-evaluation               AM-PAC PT "6 Clicks" Mobility  Outcome Measure Help needed turning from your back to your side while in a flat bed without using bedrails?: A Little Help needed moving from lying on your back to sitting on the side of a flat bed without using bedrails?: A Little Help needed moving to and from a bed to a chair (including a wheelchair)?: A Little Help needed standing up from a chair using your arms (e.g., wheelchair or bedside chair)?: A Little Help needed to walk in hospital room?: A Little Help needed climbing 3-5 steps with a railing? : A Little 6 Click Score: 18    End of Session   Activity Tolerance: Patient tolerated treatment well Patient left: in chair;with call bell/phone within reach;with chair alarm set Nurse Communication: Mobility status PT Visit Diagnosis: Other abnormalities of gait and mobility (R26.89)    Time: 1324-4010 PT Time Calculation (min) (ACUTE ONLY): 24 min   Charges:   PT Evaluation $PT Eval Moderate Complexity: 1 Mod           11/02/2021  Jacinto Halim., PT Acute Rehabilitation Services [phone]  (pager) [phone]  (office)  Eliseo Gum Hena Ewalt 11/02/2021, 1:34 PM

## 2021-11-02 NOTE — Progress Notes (Signed)
NAME:  Karina Lynch, MRN:  [id number], DOB:  1955-08-31, LOS: 1 ADMISSION DATE:  11/01/2021, CONSULTATION DATE:  11/02/21 REFERRING MD: IM TS-Mullen, CHIEF COMPLAINT: Nausea  History of Present Illness:  66 year old woman brought to the emergency room by daughter after 10 days of nausea, poor p.o. intake, diarrhea found to have severe hyponatremia.  She reports 10 days of nausea.  Poor appetite.  Gets nauseated with minimal food intake.  Denies any vomiting.  Intermittent diarrhea.  Sometimes uncontrollable, with accidents.  Again ongoing for 10 days.  Lives in [place].  Came to [place] at [employer] of her sister so they could help take care of her.  She had a couple of falls.  She describes a sensation of lightheadedness when standing up from seated position.  Given ongoing symptoms without improvement, brought to ED by daughter.  Normotensive.  Afebrile.  Labs notable for severe hyponatremia sodium 110.  Creatinine does not appear elevated.  Osmolality low at 236.  Urine sodium less than 10.  UA with leukocytes, mild pyuria with 11-20 WBCs per high-power field.  Pertinent  Medical History  ADHD, hypertension  Significant Hospital Events: Including procedures, antibiotic start and stop dates in addition to other pertinent events   7/4 brought to the hospital failure to thrive, 10 days of nausea, diarrhea, severe hyponatremia due to hypovolemia  Interim History / Subjective:  NAEON, na rising, nausea a bit better  Objective   Blood pressure 119/73, pulse 83, temperature 98 F (36.7 C), temperature source Oral, resp. rate 17, height 5' 6.5" (1.689 m), weight 97.5 kg, SpO2 99 %.        Intake/Output Summary (Last 24 hours) at 11/02/2021 1056 Last data filed at 11/02/2021 1000 Gross per 24 hour  Intake 242.96 ml  Output 2100 ml  Net -1857.04 ml   Filed Weights   11/01/21 1023  Weight: 97.5 kg    Examination: General: Ill-appearing, lying in bed HENT: Dry mucous  membranes Lungs: Clear, normal work of breathing on room air Cardiovascular: Regular rate and rhythm, no murmur Abdomen: Nontender, bowel sounds present Extremities: No edema, warm Neuro: Follows commands, no focal deficits  Resolved Hospital Problem list     Assessment & Plan:  Hyponatremia: Suspect chronic in nature over the last 10+ days with poor p.o. intake, nausea, diarrhea.  Hypovolemic hyponatremia.  Hypoosmolar.  Urine sodium less than 10.  All points to hypovolemia as cause.  Possibly exacerbated by chlorthalidone. --D5 to continue, na 122 this am (rise of 12 since initial value) --Every 4 hour sodiums --Encourage p.o. intake as able  Nausea,  diarrhea: Suspect viral illness. --Check C. Difficile --Consider abdominal imaging if not improving, nontender on exam --Low-dose IV Ativan for nausea, QTc prolonged at 560 on admission  History of hypertension: Hold chlorthalidone indefinitely.  Consider resuming home losartan in the future pending blood pressures.  ADHD: Holding home medicines at this time.  Abnormal UA: Leukocytes, pyuria.  Relatively asymptomatic but possible nausea vomiting related to cystitis. --Ceftriaxone 1 g daily x 3 days  GERD: Continue home PPI.  Anticipate transfer out of ICU  Best Practice (right click and "Reselect all SmartList Selections" daily)   Diet/type: clear liquids advance as tolerated DVT prophylaxis: LMWH GI prophylaxis: N/A Lines: N/A Foley:  N/A Code Status:  full code Last date of multidisciplinary goals of care discussion [n/a]  Labs   CBC: Recent Labs  Lab 11/01/21 1042 11/01/21 1411 11/02/21 0640  WBC 15.9* 16.6* 15.1*  NEUTROABS  --   --  11.8*  HGB 13.0 12.5 12.3  HCT 35.2* 33.7* 34.0*  MCV 73.3* 74.2* 74.1*  PLT 523* 446* 449*     Basic Metabolic Panel: Recent Labs  Lab 11/01/21 1042 11/01/21 1411 11/01/21 1828 11/01/21 2149 11/02/21 0325 11/02/21 0640  NA 110* 113* 117* 120* 123* 122*  K 3.0* 3.8   --   --   --  2.9*  CL 72* 78*  --   --   --  84*  CO2 24 25  --   --   --  26  GLUCOSE 143* 124*  --   --   --  118*  BUN 11 11  --   --   --  7*  CREATININE 0.83 0.77  --   --   --  0.74  CALCIUM 8.5* 8.2*  --   --   --  8.5*  MG  --   --   --   --   --  2.6*  PHOS  --   --   --   --   --  3.4    GFR: Estimated Creatinine Clearance: 83.3 mL/min (by C-G formula based on SCr of 0.74 mg/dL). Recent Labs  Lab 11/01/21 1042 11/01/21 1411 11/02/21 0640  WBC 15.9* 16.6* 15.1*     Liver Function Tests: Recent Labs  Lab 11/01/21 1042  AST 23  ALT 12  ALKPHOS 96  BILITOT 1.3*  PROT 7.2  ALBUMIN 3.8    Recent Labs  Lab 11/01/21 1042  LIPASE 44    No results for input(s): "AMMONIA" in the last 168 hours.  ABG No results found for: "PHART", "PCO2ART", "PO2ART", "HCO3", "TCO2", "ACIDBASEDEF", "O2SAT"   Coagulation Profile: No results for input(s): "INR", "PROTIME" in the last 168 hours.  Cardiac Enzymes: No results for input(s): "CKTOTAL", "CKMB", "CKMBINDEX", "TROPONINI" in the last 168 hours.  HbA1C: Hgb A1c MFr Bld  Date/Time Value Ref Range Status  11/02/2021 03:25 AM 5.9 (H) 4.8 - 5.6 % Final    Comment:    (NOTE) Pre diabetes:          5.7%-6.4%  Diabetes:              >6.4%  Glycemic control for   <7.0% adults with diabetes     CBG: Recent Labs  Lab 11/01/21 1015 11/01/21 1717  GLUCAP 148* 116*     Review of Systems:   No chest pain.  No headache.  No altered sensorium.  No chest pain.  No shortness of breath.  Comprehensive review of systems otherwise negative.  Past Medical History:  She,  has a past medical history of ADD (attention deficit disorder), Atopic dermatitis, Congenital dilation of aortic arch, DOE (dyspnea on exertion), DOE (dyspnea on exertion), GERD (gastroesophageal reflux disease), HTN (hypertension), IFG (impaired fasting glucose), Neuropathy, and Seasonal allergies.   Surgical History:   Past Surgical History:   Procedure Laterality Date   orif left 5th finger     righr ulnar nerve tumor excision       Social History:   reports that she has never smoked. She does not have any smokeless tobacco history on file. She reports current alcohol use. She reports that she does not use drugs.   Family History:  Her family history includes Cancer in her father and mother; Diabetes in her father.   Allergies No Known Allergies   Home Medications  Prior to Admission medications   Medication Sig Start Date End Date Taking? Authorizing Provider  buPROPion (WELLBUTRIN XL) 300 MG 24 hr tablet Take 1 tablet by mouth daily. 07/24/21  Yes [provider]  chlorthalidone (HYGROTON) 25 MG tablet Take 25 mg by mouth daily. 05/03/21  Yes [provider]  Dexmethylphenidate HCl 40 MG CP24 Take 1 capsule by mouth every morning. 01/04/21  Yes [provider]  losartan (COZAAR) 100 MG tablet Take 100 mg by mouth daily. 06/03/21  Yes [provider]  metFORMIN (GLUCOPHAGE-XR) 500 MG 24 hr tablet Take 500 mg by mouth 2 (two) times daily. 10/30/21  Yes [provider]  methylphenidate (RITALIN) 20 MG tablet Take 20-40 mg by mouth daily as needed. 07/13/21  Yes [provider]  pantoprazole (PROTONIX) 40 MG tablet Take 40 mg by mouth daily. 06/01/21  Yes [provider]     Critical care time: n/a     Karren Burly, MD See Loretha Stapler for contact info

## 2021-11-03 DIAGNOSIS — E871 Hypo-osmolality and hyponatremia: Secondary | ICD-10-CM | POA: Diagnosis not present

## 2021-11-03 LAB — BASIC METABOLIC PANEL
Anion gap: 11 (ref 5–15)
BUN: 7 mg/dL — ABNORMAL LOW (ref 8–23)
CO2: 24 mmol/L (ref 22–32)
Calcium: 8.8 mg/dL — ABNORMAL LOW (ref 8.9–10.3)
Chloride: 90 mmol/L — ABNORMAL LOW (ref 98–111)
Creatinine, Ser: 0.84 mg/dL (ref 0.44–1.00)
GFR, Estimated: >60 mL/min (ref >60)
Glucose, Bld: 103 mg/dL — ABNORMAL HIGH (ref 70–99)
Potassium: 4.8 mmol/L (ref 3.5–5.1)
Sodium: 125 mmol/L — ABNORMAL LOW (ref 135–145)

## 2021-11-03 LAB — SODIUM
Sodium: 128 mmol/L — ABNORMAL LOW (ref 135–145)
Sodium: 128 mmol/L — ABNORMAL LOW (ref 135–145)

## 2021-11-03 MED ORDER — MELATONIN 3 MG PO TABS: 3.0000 mg | ORAL_TABLET | Freq: Once | ORAL | Status: DC

## 2021-11-03 NOTE — Evaluation (Signed)
Occupational Therapy Evaluation Patient Details Name: Karina Lynch MRN: [id number] DOB: 1955-11-16 Today's Date: 11/03/2021   History of Present Illness pt is a 66 y/o female admitted to the [facility] 7/4 for generalized weakness.  work up included hyponatremia and suspected viral gastroenteritis.  PMHx: ADHD, HTN   Clinical Impression   Pt. Was seen to be evaluated for OT needs. Pt. Was able to amb in room without ad and without lob. Pt. Was able to demo ability to perform ADLs and toilet transfers without assist. Pt. States she does feel weak and wants to dc home with sister for a weak to recover. Pt. Sister has a walk in shower with built in seat that pt. Can use. If pt. Does not dc home with sister she may benefit from shower seat secondary to weakness. No further OT needed.      Recommendations for follow up therapy are one component of a multi-disciplinary discharge planning process, led by the attending physician.  Recommendations may be updated based on patient status, additional functional criteria and insurance authorization.   Follow Up Recommendations  No OT follow up    Assistance Recommended at Discharge Intermittent Supervision/Assistance  Patient can return home with the following Assistance with cooking/housework    Functional Status Assessment  Patient has had a recent decline in their functional status and demonstrates the ability to make significant improvements in function in a reasonable and predictable amount of time.  Equipment Recommendations   (Pt. sister wants her to dc home with her and she has a shower seat.) If pt does not dc home with sister she may benefit from shower seat secondary to weakness.    Recommendations for Other Services       Precautions / Restrictions Precautions Precautions: Fall      Mobility Bed Mobility Overal bed mobility: Modified Independent Bed Mobility: Supine to Sit     Supine to sit: Modified independent (Device/Increase  time)          Transfers     Transfers: Sit to/from Stand Sit to Stand: Modified independent (Device/Increase time)           General transfer comment: able to amb with bathroom without lob and use of ad      Balance     Sitting balance-Leahy Scale: Good       Standing balance-Leahy Scale: Fair                             ADL either performed or assessed with clinical judgement   ADL Overall ADL's : Modified independent                                       General ADL Comments: Pt. is able to perform adl tasks and toilet transfer at mod i level.     Vision Baseline Vision/History: 1 Wears glasses Ability to See in Adequate Light: 0 Adequate Patient Visual Report: No change from baseline Vision Assessment?: No apparent visual deficits     Perception     Praxis      Pertinent Vitals/Pain Pain Assessment Pain Assessment: No/denies pain     Hand Dominance Right   Extremity/Trunk Assessment Upper Extremity Assessment Upper Extremity Assessment: Overall WFL for tasks assessed   Lower Extremity Assessment Lower Extremity Assessment: Defer to PT evaluation  Communication Communication Communication: No difficulties   Cognition Arousal/Alertness: Awake/alert Behavior During Therapy: WFL for tasks assessed/performed Overall Cognitive Status: Within Functional Limits for tasks assessed                                       General Comments       Exercises     Shoulder Instructions      Home Living Family/patient expects to be discharged to:: Private residence Living Arrangements: Alone Available Help at Discharge: Other (Comment) Type of Home: Apartment Home Access: Stairs to enter Entrance Stairs-Number of Steps: flight Entrance Stairs-Rails: Right;Left Home Layout: One level     Bathroom Shower/Tub: Chief Strategy Officer: Standard     Home Equipment: None           Prior Functioning/Environment Prior Level of Function : Independent/Modified Independent;Working/employed;Driving               ADLs Comments: I with adls and iadls.        OT Problem List:        OT Treatment/Interventions:      OT Goals(Current goals can be found in the care plan section) Acute Rehab OT Goals Patient Stated Goal: to go home  OT Frequency:      Co-evaluation              AM-PAC OT "6 Clicks" Daily Activity     Outcome Measure Help from another person eating meals?: None Help from another person taking care of personal grooming?: None Help from another person toileting, which includes using toliet, bedpan, or urinal?: None Help from another person bathing (including washing, rinsing, drying)?: None Help from another person to put on and taking off regular upper body clothing?: None Help from another person to put on and taking off regular lower body clothing?: None 6 Click Score: 24   End of Session Nurse Communication:  (ok therapy)  Activity Tolerance: Patient limited by fatigue Patient left: in bed;with call bell/phone within reach;with family/visitor present  OT Visit Diagnosis: Unsteadiness on feet (R26.81)                Time: 6295-2841 OT Time Calculation (min): 26 min Charges:  OT General Charges $OT Visit: 1 Visit OT Evaluation $OT Eval Moderate Complexity: 1 Mod  Lenoir Facchini OT/L   Aldahir Litaker 11/03/2021, 11:40 AM

## 2021-11-03 NOTE — Progress Notes (Signed)
Physical Therapy Treatment Patient Details Name: Karina Lynch MRN: [id number] DOB: [birth year] Today's Date: 11/03/2021   History of Present Illness pt is a 66 y/o female admitted to the [facility] 7/4 for generalized weakness.  work up included hyponatremia and suspected viral gastroenteritis.  PMHx: ADHD, HTN   PT Comments    Patient is making progress with functional independence. She ambulated in hallway without assistive device with mild unsteadiness initially that was self corrected. Overall activity tolerance is impaired from baseline and patient reports generalized weakness that is improving. Educated patient on energy conservation techniques and progressing activity slowly with routine short bouts of walking for strength/conditioning. Anticipate patient can return home with support from her sister.    Recommendations for follow up therapy are one component of a multi-disciplinary discharge planning process, led by the attending physician.  Recommendations may be updated based on patient status, additional functional criteria and insurance authorization.  Follow Up Recommendations  No PT follow up     Assistance Recommended at Discharge Set up Supervision/Assistance  Patient can return home with the following Assistance with cooking/housework;Assist for transportation   Equipment Recommendations  None recommended by PT    Recommendations for Other Services       Precautions / Restrictions Precautions Precautions: Fall Restrictions Weight Bearing Restrictions: No     Mobility  Bed Mobility Overal bed mobility: Modified Independent                  Transfers Overall transfer level: Needs assistance Equipment used: None Transfers: Sit to/from Stand Sit to Stand: Modified independent (Device/Increase time)           General transfer comment: good safety awareness with functional transfers    Ambulation/Gait Ambulation/Gait assistance: Supervision Gait  Distance (Feet): 250 Feet Assistive device: None Gait Pattern/deviations: Step-through pattern Gait velocity: decreased     General Gait Details: mild unsteadiness initially that was self corrected. she reports feeling generalized LE weakness and fatigue with activity. educated patient on energy conservation techniques, progressing activity slowly, and walking to maintain strength and conditioning   Stairs             Wheelchair Mobility    Modified Rankin (Stroke Patients Only)       Balance           Standing balance support: No upper extremity supported Standing balance-Leahy Scale: Fair Standing balance comment: no external support required                            Cognition Arousal/Alertness: Awake/alert Behavior During Therapy: WFL for tasks assessed/performed Overall Cognitive Status: Within Functional Limits for tasks assessed                                          Exercises      General Comments General comments (skin integrity, edema, etc.): heart rate 98pm after ambulating and no shortness of breath is noted with activity.      Pertinent Vitals/Pain Pain Assessment Pain Assessment: No/denies pain    Home Living Family/patient expects to be discharged to:: Private residence Living Arrangements: Alone Available Help at Discharge: Other (Comment) Type of Home: Apartment Home Access: Stairs to enter Entrance Stairs-Rails: Right;Left Entrance Stairs-Number of Steps: flight   Home Layout: One level Home Equipment: None      Prior Function  PT Goals (current goals can now be found in the care plan section) Acute Rehab PT Goals Patient Stated Goal: to return home and back to work PT Goal Formulation: With patient Time For Goal Achievement: 11/16/21 Potential to Achieve Goals: Good Progress towards PT goals: Progressing toward goals    Frequency    Min 3X/week      PT Plan Current plan  remains appropriate    Co-evaluation              AM-PAC PT "6 Clicks" Mobility   Outcome Measure  Help needed turning from your back to your side while in a flat bed without using bedrails?: A Little Help needed moving from lying on your back to sitting on the side of a flat bed without using bedrails?: A Little Help needed moving to and from a bed to a chair (including a wheelchair)?: A Little Help needed standing up from a chair using your arms (e.g., wheelchair or bedside chair)?: A Little Help needed to walk in hospital room?: A Little Help needed climbing 3-5 steps with a railing? : A Little 6 Click Score: 18    End of Session   Activity Tolerance: Patient tolerated treatment well Patient left: in bed;with call bell/phone within reach;with family/visitor present   PT Visit Diagnosis: Other abnormalities of gait and mobility (R26.89)     Time: 7001-7494 PT Time Calculation (min) (ACUTE ONLY): 23 min  Charges:  $Gait Training: 8-22 mins $Therapeutic Activity: 8-22 mins                     Donna Bernard, PT, MPT    Karina Lynch 11/03/2021, 1:45 PM

## 2021-11-03 NOTE — Progress Notes (Signed)
eLink Physician-Brief Progress Note Patient Name: Karina Lynch DOB: 29-Dec-1955 MRN: [id number]   Date of Service  11/03/2021  HPI/Events of Note  Patient request for sleep aid.   eICU Interventions  Plan: Melatonin 3 mg PO X 1.      Intervention Category Major Interventions: Other:  Brendt Dible Dennard Nip 11/03/2021, 2:20 AM

## 2021-11-03 NOTE — TOC CM/SW Note (Signed)
  Transition of Care [facility]) Screening Note   Patient Details  Name: Karina Lynch Date of Birth: [birth year]     Transition of Care Department [facility]) has reviewed patient and no TOC needs have been identified at this time. We will continue to monitor patient advancement through interdisciplinary progression rounds. If new patient transition needs arise, please place a TOC consult.

## 2021-11-03 NOTE — Progress Notes (Deleted)
Patient arrived back to 6 north room 23 alert and oriented. Pain level 2/10. 4 port sites with skin glue clean dry and intact. Will continue to monitor patient

## 2021-11-03 NOTE — Progress Notes (Signed)
NAME:  Karina Lynch, MRN:  [id number], DOB:  [birth year], LOS: 2 ADMISSION DATE:  11/01/2021  Subjective  Patient evaluated at bedside this AM. Reports she is feeling well, only has some hoarseness which has been present for a few days. No episodes of vomiting or diarrhea in the last couple of days. Overall feels much improved.   Objective   Blood pressure 120/73, pulse 91, temperature 97.9 F (36.6 C), temperature source Oral, resp. rate 17, height 5' 6.5" (1.689 m), weight 97.5 kg, SpO2 98 %.     Intake/Output Summary (Last 24 hours) at 11/03/2021 1559 Last data filed at 11/03/2021 1300 Gross per 24 hour  Intake 1730.43 ml  Output 800 ml  Net 930.43 ml   Filed Weights   11/01/21 1023  Weight: 97.5 kg   Physical Exam: General: Well-appearing, laying in bed in no acute distress CV: Regular rate, rhythm. No murmurs appreciated. Warm extremities.  Pulm: Normal work of breathing on room air. Clear to ausculation bilaterally.  Abdomen: Soft, non-tender, non-distended. Normoactive bowel sounds. MSK: Normal bulk, tone. No pitting edema bilaterally.  Neuro: Awake, alert, conversing appropriately. Grossly non-focal Psych: Pleasant, normal mood, affect. Hoarse voice, normal speech.   Labs       Latest Ref Rng & Units 11/02/2021    6:40 AM 11/01/2021    2:11 PM 11/01/2021   10:42 AM  CBC  WBC 4.0 - 10.5 K/uL 15.1  16.6  15.9   Hemoglobin 12.0 - 15.0 g/dL 81.0  17.5  10.2   Hematocrit 36.0 - 46.0 % 34.0  33.7  35.2   Platelets 150 - 400 K/uL 449  446  523       Latest Ref Rng & Units 11/03/2021    2:14 PM 11/03/2021    9:20 AM 11/03/2021    1:17 AM  BMP  Glucose 70 - 99 mg/dL   585   BUN 8 - 23 mg/dL   7   Creatinine 2.77 - 1.00 mg/dL   8.24   Sodium 235 - 361 mmol/L 128  128  125   Potassium 3.5 - 5.1 mmol/L   4.8   Chloride 98 - 111 mmol/L   90   CO2 22 - 32 mmol/L   24   Calcium 8.9 - 10.3 mg/dL   8.8     Summary   Karina Lynch is 66yo person with type II diabetes mellitus,  GERD, hypertension admitted 7/4 for severe chronic hypovolemic hyponatremia, initially transferred to ICU for close monitoring, now back on floor.   Assessment & Plan:  Principal Problem:   Hyponatremia Active Problems:   Essential hypertension   Viral gastroenteritis   Type II diabetes mellitus (HCC)   GERD (gastroesophageal reflux disease)  #Severe, chronic hypovolemic hyponatremia #Viral gastroenteritis Sodium has slowly risen over last 48 hours. She was deemed appropriate to be transferred to floor yesterday. This morning, she seems to be doing well, has been tolerating clears well over last 24h. We will continue to encourage PO intake for continued improvement of sodium. Latest Na 128, baseline is 133-134. At this juncture will hold off q6h monitoring and will have lab holiday this evening.  If she is tolerating PO tomorrow, likely can d/c with PCP follow-up within a week. - Encourage PO intake, advance diet as tolerated - Follow-up BMP in AM - D/C tomorrow if Na improves - Follow-up PCP in 1 week - Ativan for nausea (prolonged QT)  #Hypertension BP has been at goal off of  anti-hypertensives. Was previously on chlorthalidone prior to hospitalization, would consider d/c this, could have contributed to severe hyponatremia. - Hold home antihypertensives - Consider d/c chlorthalidone at discharge  #Type II diabetes mellitus Sugars have remained at goal. Repeat A1c during this admission 5.9%, will hold off CBG monitoring or insulin.  #Abnormal urinalysis Patient did not have urinary symptoms and still does not. Will hold off any further antibiotics.   Best practice:  DIET: Clears, ADAT IVF: n/a DVT PPX: lovenox BOWEL: miralax CODE: FULL FAM COM: n/a  Evlyn Kanner, MD Internal Medicine Resident PGY-3 PAGER: [phone] 11/03/2021 3:59 PM  If after hours (below), please contact on-call pager: [phone] 5PM-7AM Monday-Friday 1PM-7AM Saturday-Sunday

## 2021-11-04 DIAGNOSIS — E871 Hypo-osmolality and hyponatremia: Secondary | ICD-10-CM | POA: Diagnosis not present

## 2021-11-04 LAB — BASIC METABOLIC PANEL
Anion gap: 12 (ref 5–15)
BUN: 5 mg/dL — ABNORMAL LOW (ref 8–23)
CO2: 23 mmol/L (ref 22–32)
Calcium: 8.5 mg/dL — ABNORMAL LOW (ref 8.9–10.3)
Chloride: 94 mmol/L — ABNORMAL LOW (ref 98–111)
Creatinine, Ser: 0.84 mg/dL (ref 0.44–1.00)
GFR, Estimated: >60 mL/min (ref >60)
Glucose, Bld: 112 mg/dL — ABNORMAL HIGH (ref 70–99)
Potassium: 4 mmol/L (ref 3.5–5.1)
Sodium: 129 mmol/L — ABNORMAL LOW (ref 135–145)

## 2021-11-04 NOTE — Plan of Care (Signed)

## 2021-11-04 NOTE — Progress Notes (Signed)
Subjective:   Summary: Karina Lynch is a 66 y.o. year old female currently admitted on the [facility] HD#3 for hyponatremia secondary to GI foodborne illness.  Overnight Events: - No acute events overnight - Denies any nausea or vomiting, shortness of breath, chest pain, fainting spells or loss of consciousness, dysuria or frequent urination.  Mild confusion and weakness which is significantly improved compared to when she first came into the hospital.  She had 1 episode of diarrhea this morning, but attributes that to her diet. - Family present in the room and we discussed the plan and follow-up and they were all amenable.  She asked if she could have a couple weeks off of work to recover.  Objective:  Vital signs in last 24 hours: Vitals:   11/03/21 0808 11/03/21 1600 11/03/21 1943 11/04/21 0539  BP: 120/73 135/74 137/67 116/75  Pulse: 91 86 77 82  Resp: 17 18 18 18   Temp: 97.9 F (36.6 C) 97.7 F (36.5 C) 97.6 F (36.4 C) 98.2 F (36.8 C)  TempSrc: Oral Oral Oral   SpO2: 98% 100% 100% 98%  Weight:      Height:       Supplemental O2: Room Air SpO2: 98 % O2 Flow Rate (L/min): 3 L/min   Physical Exam:  Constitutional: well-appearing female sitting in chair, in no acute distress.  Interactive, responsive to questions and pleasant. Cardiovascular: RRR, no murmurs, rubs or gallops Pulmonary/Chest: normal work of breathing on room air, lungs clear to auscultation bilaterally Abdominal: soft, non-tender, non-distended Skin: warm and dry  Filed Weights   11/01/21 1023  Weight: 97.5 kg     Intake/Output Summary (Last 24 hours) at 11/04/2021 0625 Last data filed at 11/03/2021 1700 Gross per 24 hour  Intake 1320 ml  Output --  Net 1320 ml   Net IO Since Admission: -124.79 mL [11/04/21 0625]  Pertinent Labs:    Latest Ref Rng & Units 11/02/2021    6:40 AM 11/01/2021    2:11 PM 11/01/2021   10:42 AM  CBC  WBC 4.0 - 10.5 K/uL 15.1  16.6  15.9   Hemoglobin  12.0 - 15.0 g/dL 01/02/2022  93.8  18.2   Hematocrit 36.0 - 46.0 % 34.0  33.7  35.2   Platelets 150 - 400 K/uL 449  446  523        Latest Ref Rng & Units 11/04/2021   12:54 AM 11/03/2021    2:14 PM 11/03/2021    9:20 AM  CMP  Glucose 70 - 99 mg/dL 01/04/2022     BUN 8 - 23 mg/dL 5     Creatinine 716 - 1.00 mg/dL 9.67     Sodium 8.93 - 810 mmol/L 129  128  128   Potassium 3.5 - 5.1 mmol/L 4.0     Chloride 98 - 111 mmol/L 94     CO2 22 - 32 mmol/L 23     Calcium 8.9 - 10.3 mg/dL 8.5         Imaging:   EKG:   Assessment/Plan:   Principal Problem:   Hyponatremia Active Problems:   Essential hypertension   Viral gastroenteritis   Type II diabetes mellitus (HCC)   GERD (gastroesophageal reflux disease)   Patient Summary: Karina Lynch is a 66 y.o. with a pertinent PMH of type 2 diabetes, GERD, HTN, who presented with nausea vomiting diarrhea and admitted for  severe hypovolemic hyponatremia.    #Severe, chronic hypovolemic hyponatremia #Viral gastroenteritis We will continue to encourage PO intake for continued improvement of sodium. Latest Na 129, baseline is 133-134. A if she continues to tolerate p.o, and has no worsening symptoms we can likely discharge her tomorrow with close follow-up next week. - Encourage PO intake, advance diet as tolerated - Follow-up BMP in AM - D/C tomorrow if Na improves - Follow-up PCP in 1 week - Ativan for nausea (prolonged QT)   #Hypertension BP has been at goal off of anti-hypertensives. Was previously on chlorthalidone prior to hospitalization, would consider d/c this, could have contributed to severe hyponatremia. - Hold home antihypertensives - d/c chlorthalidone at discharge   #Type II diabetes mellitus Sugars have remained at goal. Repeat A1c during this admission 5.9%, will hold off CBG monitoring or insulin.   #Abnormal urinalysis Patient did not have urinary symptoms and still does not. Will hold off any further antibiotics.  Diet:   Advancing diet as tolerated IVF: None, VTE: Enoxaparin Code: Full PT/OT recs: None, none. TOC recs:  Family Update:   Dispo: Anticipated discharge to Home in 1 days pending tolerating diet, sodium continues to increase.   Lyndle Herrlich, MD PGY-1 Internal Medicine Resident Please contact the on call pager after 5 pm and on weekends at [phone].

## 2021-11-05 LAB — BASIC METABOLIC PANEL
Anion gap: 10 (ref 5–15)
BUN: 9 mg/dL (ref 8–23)
CO2: 23 mmol/L (ref 22–32)
Calcium: 8.4 mg/dL — ABNORMAL LOW (ref 8.9–10.3)
Chloride: 99 mmol/L (ref 98–111)
Creatinine, Ser: 0.84 mg/dL (ref 0.44–1.00)
GFR, Estimated: >60 mL/min (ref >60)
Glucose, Bld: 110 mg/dL — ABNORMAL HIGH (ref 70–99)
Potassium: 4.4 mmol/L (ref 3.5–5.1)
Sodium: 132 mmol/L — ABNORMAL LOW (ref 135–145)

## 2021-11-05 MED ORDER — METFORMIN HCL ER 500 MG PO TB24: 500.0000 mg | ORAL_TABLET | Freq: Two times a day (BID) | ORAL | 3 refills | Status: DC

## 2021-11-05 MED ORDER — BUPROPION HCL ER (XL) 300 MG PO TB24: 300.0000 mg | ORAL_TABLET | Freq: Every day | ORAL | 2 refills | Status: AC

## 2021-11-05 MED ORDER — PANTOPRAZOLE SODIUM 40 MG PO TBEC: 40.0000 mg | DELAYED_RELEASE_TABLET | Freq: Every day | ORAL | 2 refills | Status: DC

## 2021-11-05 MED ORDER — LOSARTAN POTASSIUM 100 MG PO TABS: 100.0000 mg | ORAL_TABLET | Freq: Every day | ORAL | 2 refills | Status: DC

## 2021-11-05 NOTE — Discharge Summary (Addendum)
Name: Karina Lynch MRN: [id number] DOB: 02/05/1956 66 y.o. PCP: Patient, No Pcp Per  Date of Admission: 11/01/2021 10:16 AM Date of Discharge:   11/05/2021 Attending Physician: Dr. Criselda Peaches  Discharge Diagnosis: Principal Problem:   Hyponatremia Active Problems:   Essential hypertension   Viral gastroenteritis   Type II diabetes mellitus (HCC)   GERD (gastroesophageal reflux disease)    Discharge Medications: Allergies as of 11/05/2021   No Known Allergies      Medication List     STOP taking these medications    chlorthalidone 25 MG tablet Commonly known as: HYGROTON       TAKE these medications    buPROPion 300 MG 24 hr tablet Commonly known as: WELLBUTRIN XL Take 1 tablet (300 mg total) by mouth daily.   Dexmethylphenidate HCl 40 MG Cp24 Take 1 capsule by mouth every morning.   losartan 100 MG tablet Commonly known as: COZAAR Take 1 tablet (100 mg total) by mouth daily.   metFORMIN 500 MG 24 hr tablet Commonly known as: GLUCOPHAGE-XR Take 1 tablet (500 mg total) by mouth 2 (two) times daily.   methylphenidate 20 MG tablet Commonly known as: RITALIN Take 20-40 mg by mouth daily as needed.   pantoprazole 40 MG tablet Commonly known as: PROTONIX Take 1 tablet (40 mg total) by mouth daily.        Disposition and follow-up:   [name] was discharged from [facility] in Stable condition.  At the hospital follow up visit please address:  1.  Follow-up:  a.  Hypovolemic hyponatremia: Admitted with a sodium of 110. Had a brief ICU stay for close monitoring of sodium which improved to 132 at discharge. Ensure patient is staying hydrated and tolerating p.o. intake.  Repeat BMP to ensure sodium is stable.    b.  Hypertension: Patient's chlorthalidone was discontinued on admission due to hypovolemic hyponatremia.  Consider a different 2nd antihypertensive agent if BP remains above goal   c.  Diabetes: Currently well controlled  with A1c of 5.9% on admission. Discharged on home metformin   d.  Leukocytosis/thrombocytosis: White count and platelet remain elevated during admission.  Repeat CBC and consider further work-up as appropriate.   e.  Iron deficiency anemia: Found to have iron of 17 and iron saturation of 4% but hemoglobin remained stable at 12.3 on day of discharge. Consider oral supplementation or work-up at your discretion.  2.  Labs / imaging needed at time of follow-up: BMP, CBC  3.  Pending labs/ test needing follow-up: N/A  Follow-up Appointments:  Follow-up Information     [facility]. Go on 11/09/2021.   Why: Please go this appointment at the basement of the hospital on Wednesday 11/09/21 at 10:45 pm. Arrive 15 minutes prior to your appointment time. Contact information: [address] [place] [address] [phone]                Hospital Course by problem list:  Severe hypovolemic hyponatremia Patient presented to [facility] ER after 10d of GI losses with minimal PO intake and found to have sodium severely low at 110. This was thought to be secondary to severe dehydration in the setting of GI losses from a viral gastroenteritis and being on chronic thiazide diuretic. She remains neurologically intact and hemodynamically stable but was transferred to the ICU on admission for close monitoring of her sodium. Sodium improved to 125 with hydration.  She had mild hypokalemia  that resolved with IV fluids potassium repletion. She was transferred back to internal medicine teaching service after 2 days of ICU stay her sodium continued to improve to 132 on day of discharge. PT and OT evaluated patient and did not recommend any further follow-up. She was able to tolerate a full diet and was discharged with plan to follow-up with internal medicine clinic for 1 week hospital follow-up. Her chlorthalidone was discontinued at discharge.  She will need repeat BMP to  ensure her sodium levels remain stable.    Hypertension Patient was normotensive on admission so her losartan and chlorthalidone were held. SBP remained in the 120s to 130s throughout her hospitalization. Her chlorthalidone was discontinued at discharge and her losartan was restarted at discharge.   Type II diabetes mellitus Repeat A1c on admission improved to 5.9%. Patient's metformin was held in the setting of recent GI illness. It was resumed at discharge.   Leukocytosis Thrombocytosis Iron deficiency anemia Patient arrived with significant leukocytosis and thrombocytosis. Differential showed neutrophil predominance. Per chart review, she had a minor chronic thrombocytosis of unclear etiology. She was also found to have iron level of 17 with saturation of 4% and ferritin 26. Hemoglobin however remained stable at 12-13.  She would likely need outpatient evaluation and management.  GERD Chronic cough Symptoms improved during hospitalization.  Continued home pantoprazole 40mg  daily   Subjective: Patient was evaluated at the bedside with her sister in the room. States she tolerated her dinner well yesterday. She has no acute complaints today and feels ready to go home. Discussed plan to send medications to her pharmacy for patient to follow-up with internal medicine clinic next Wednesday.  Discharge exam Discharge Vitals:   BP 117/72 (BP Location: Right Arm)   Pulse 74   Temp 98.1 F (36.7 C) (Oral)   Resp 14   Ht 5' 6.5" (1.689 m)   Wt 97.5 kg   SpO2 97%   BMI 34.17 kg/m   General: Pleasant, well-appearing elderly woman sitting in bed. No acute distress. CV: RRR. No murmurs, rubs, or gallops. No LE edema Pulmonary: Lungs CTAB. Normal effort. No wheezing or rales. Abdominal: Soft, nontender, nondistended. Normal bowel sounds. Extremities: Palpable radial and DP pulses. Normal ROM. Skin: Warm and dry. No obvious rash or lesions. Neuro: A&Ox3. Moves all extremities. Normal  sensation. No focal deficit. Psych: Normal mood and affect  Pertinent Labs, Studies, and Procedures:     Latest Ref Rng & Units 11/02/2021    6:40 AM 11/01/2021    2:11 PM 11/01/2021   10:42 AM  CBC  WBC 4.0 - 10.5 K/uL 15.1  16.6  15.9   Hemoglobin 12.0 - 15.0 g/dL 01/02/2022  50.5  39.7   Hematocrit 36.0 - 46.0 % 34.0  33.7  35.2   Platelets 150 - 400 K/uL 449  446  523        Latest Ref Rng & Units 11/04/2021   12:54 AM 11/03/2021    2:14 PM 11/03/2021    9:20 AM  CMP  Glucose 70 - 99 mg/dL 01/04/2022     BUN 8 - 23 mg/dL 5     Creatinine 419 - 1.00 mg/dL 3.79     Sodium 0.24 - 097 mmol/L 129  128  128   Potassium 3.5 - 5.1 mmol/L 4.0     Chloride 98 - 111 mmol/L 94     CO2 22 - 32 mmol/L 23     Calcium 8.9 - 10.3 mg/dL 8.5  DG CHEST PORT 1 VIEW  Result Date: 11/01/2021 CLINICAL DATA:  Nausea vomiting.  Cough and congestion. EXAM: PORTABLE CHEST 1 VIEW COMPARISON:  06/03/2012 FINDINGS: The lungs are clear without focal pneumonia, edema, pneumothorax or pleural effusion. Streaky opacity in the bases suggest atelectasis or scarring. The cardiopericardial silhouette is within normal limits for size. Bones are diffusely demineralized. IMPRESSION: Streaky opacity in the lung bases suggest atelectasis or scarring. Otherwise no acute cardiopulmonary findings. Electronically Signed   By: Kennith Center M.D.   On: 11/01/2021 12:46     Discharge Instructions: Ms. Kram, It was a pleasure taking care of you at [facility]. You were admitted for very low sodium levels and were initially treated in the ICU. We are discharging you home now that you are doing better and your sodium levels have improved. We have discontinued your chlorthalidone. Please follow the following instructions.  1) Continue taking all your medications as prescribed. 2) Follow-up with internal medicine clinic on Wednesday 7/12 at 10:45 am for hospital follow-up.  3) Make sure to stay hydrated by drinking about 2 L of water  daily  Take care,  Dr. Sharrell Ku, MD, MPH  Signed: Steffanie Rainwater, MD 11/05/2021, 6:42 AM   Pager: [phone]

## 2021-11-05 NOTE — Plan of Care (Signed)
  Problem: Education: Goal: Knowledge of General Education information will improve Description: Including pain rating scale, medication(s)/side effects and non-pharmacologic comfort measures 11/05/2021 1333 by Karolee Ohs, RN Outcome: Adequate for Discharge 11/05/2021 1112 by Karolee Ohs, RN Outcome: Progressing   Problem: Health Behavior/Discharge Planning: Goal: Ability to manage health-related needs will improve 11/05/2021 1333 by Karolee Ohs, RN Outcome: Adequate for Discharge 11/05/2021 1112 by Karolee Ohs, RN Outcome: Progressing   Problem: Clinical Measurements: Goal: Ability to maintain clinical measurements within normal limits will improve Outcome: Adequate for Discharge Goal: Will remain free from infection Outcome: Adequate for Discharge Goal: Diagnostic test results will improve Outcome: Adequate for Discharge Goal: Respiratory complications will improve Outcome: Adequate for Discharge Goal: Cardiovascular complication will be avoided Outcome: Adequate for Discharge   Problem: Activity: Goal: Risk for activity intolerance will decrease Outcome: Adequate for Discharge   Problem: Nutrition: Goal: Adequate nutrition will be maintained Outcome: Adequate for Discharge   Problem: Coping: Goal: Level of anxiety will decrease Outcome: Adequate for Discharge   Problem: Elimination: Goal: Will not experience complications related to bowel motility Outcome: Adequate for Discharge Goal: Will not experience complications related to urinary retention Outcome: Adequate for Discharge   Problem: Pain Managment: Goal: General experience of comfort will improve Outcome: Adequate for Discharge   Problem: Safety: Goal: Ability to remain free from injury will improve Outcome: Adequate for Discharge   Problem: Skin Integrity: Goal: Risk for impaired skin integrity will decrease Outcome: Adequate for Discharge   Problem: Acute Rehab PT Goals(only PT should  resolve) Goal: Pt Will Transfer Bed To Chair/Chair To Bed Outcome: Adequate for Discharge Goal: Pt Will Ambulate Outcome: Adequate for Discharge Goal: Pt Will Go Up/Down Stairs Outcome: Adequate for Discharge   Problem: Acute Rehab PT Goals(only PT should resolve) Goal: Pt Will Transfer Bed To Chair/Chair To Bed Outcome: Adequate for Discharge Goal: Pt Will Ambulate Outcome: Adequate for Discharge Goal: Pt Will Go Up/Down Stairs Outcome: Adequate for Discharge

## 2021-11-05 NOTE — Progress Notes (Signed)
Nsg Discharge Note  Admit Date:  11/01/2021 Discharge date: 11/05/2021   Scharlene Gloss to be D/C'd Home per MD order.  AVS completed.   Removed IV-CDI. Reviewed d/c paperwork with patient and answered all questions. Wheeled stable patient to main entrance where she was picked up by her sister to d/c to hom. Patient/caregiver able to verbalize understanding.  Discharge Medication: Allergies as of 11/05/2021   No Known Allergies      Medication List     STOP taking these medications    chlorthalidone 25 MG tablet Commonly known as: HYGROTON       TAKE these medications    buPROPion 300 MG 24 hr tablet Commonly known as: WELLBUTRIN XL Take 1 tablet (300 mg total) by mouth daily.   Dexmethylphenidate HCl 40 MG Cp24 Take 1 capsule by mouth every morning.   losartan 100 MG tablet Commonly known as: COZAAR Take 1 tablet (100 mg total) by mouth daily.   metFORMIN 500 MG 24 hr tablet Commonly known as: GLUCOPHAGE-XR Take 1 tablet (500 mg total) by mouth 2 (two) times daily.   methylphenidate 20 MG tablet Commonly known as: RITALIN Take 20-40 mg by mouth daily as needed.   pantoprazole 40 MG tablet Commonly known as: PROTONIX Take 1 tablet (40 mg total) by mouth daily.        Discharge Assessment: Vitals:   11/05/21 0624 11/05/21 0804  BP: 117/72 136/72  Pulse: 74 89  Resp: 14 18  Temp: 98.1 F (36.7 C) 97.8 F (36.6 C)  SpO2: 97% 100%   Skin clean, dry and intact without evidence of skin break down, no evidence of skin tears noted. IV catheter discontinued intact. Site without signs and symptoms of complications - no redness or edema noted at insertion site, patient denies c/o pain - only slight tenderness at site.  Dressing with slight pressure applied.  D/c Instructions-Education: Discharge instructions given to patient/family with verbalized understanding. D/c education completed with patient/family including follow up instructions, medication list, d/c  activities limitations if indicated, with other d/c instructions as indicated by MD - patient able to verbalize understanding, all questions fully answered. Patient instructed to return to ED, call 911, or call MD for any changes in condition.  Patient escorted via WC, and D/C home via private auto.  Karolee Ohs, RN 11/05/2021 1:50 PM

## 2021-11-05 NOTE — Plan of Care (Signed)
  Problem: Education: Goal: Knowledge of General Education information will improve Description Including pain rating scale, medication(s)/side effects and non-pharmacologic comfort measures Outcome: Progressing   Problem: Health Behavior/Discharge Planning: Goal: Ability to manage health-related needs will improve Outcome: Progressing   

## 2021-11-05 NOTE — Discharge Instructions (Addendum)
Karina Lynch, It was a pleasure taking care of you at [facility]. You were admitted for very low sodium levels and were initially treated in the ICU. We are discharging you home now that you are doing better and your sodium levels have improved. We have discontinued your chlorthalidone. Please follow the following instructions.  1) Continue taking all your medications as prescribed. 2) Follow-up with internal medicine clinic on Wednesday 7/12 at 10:45 am for hospital follow-up.  3) Make sure to stay hydrated by drinking about 2 L of water daily  Take care,  Dr. Sharrell Ku, MD, MPH

## 2021-11-05 NOTE — Progress Notes (Signed)
Mobility Specialist Progress Note:   11/05/21 1138  Mobility  Activity Ambulated with assistance in hallway  Level of Assistance Independent  Assistive Device None  Distance Ambulated (ft) 1200 ft  Activity Response Tolerated well  $Mobility charge 1 Mobility   Pt received in bed willing to participate in mobility. No complaints of pain. Left in bed with call bell in reach and all needs met.   [facility] Demmi Sindt Mobility Specialist

## 2021-11-07 ENCOUNTER — Encounter (HOSPITAL_COMMUNITY): Payer: Self-pay

## 2021-11-09 ENCOUNTER — Encounter: Payer: Self-pay | Admitting: Student

## 2021-11-09 ENCOUNTER — Other Ambulatory Visit: Payer: Self-pay

## 2021-11-09 ENCOUNTER — Ambulatory Visit (INDEPENDENT_AMBULATORY_CARE_PROVIDER_SITE_OTHER): Payer: Federal, State, Local not specified - PPO | Admitting: Student

## 2021-11-09 VITALS — BP 129/78 | HR 108 | Temp 98.7°F | Ht 65.0 in | Wt 201.2 lb

## 2021-11-09 DIAGNOSIS — Z7984 Long term (current) use of oral hypoglycemic drugs: Secondary | ICD-10-CM | POA: Diagnosis not present

## 2021-11-09 DIAGNOSIS — E1169 Type 2 diabetes mellitus with other specified complication: Secondary | ICD-10-CM | POA: Diagnosis not present

## 2021-11-09 DIAGNOSIS — E871 Hypo-osmolality and hyponatremia: Secondary | ICD-10-CM | POA: Diagnosis not present

## 2021-11-09 DIAGNOSIS — Z0000 Encounter for general adult medical examination without abnormal findings: Secondary | ICD-10-CM | POA: Insufficient documentation

## 2021-11-09 DIAGNOSIS — I10 Essential (primary) hypertension: Secondary | ICD-10-CM | POA: Diagnosis not present

## 2021-11-09 DIAGNOSIS — Z23 Encounter for immunization: Secondary | ICD-10-CM | POA: Insufficient documentation

## 2021-11-09 NOTE — Assessment & Plan Note (Signed)
Last A1c of 5.9% on metformin XR 500 mg BID. Continue regimen and follow up in 6 months.

## 2021-11-09 NOTE — Assessment & Plan Note (Addendum)
Patient new to establish with our clinic. Previous PCP has planned for DEXA scan, uncertain of when next colonoscopy or mammogram are due. Will have prior PCP send records.

## 2021-11-09 NOTE — Patient Instructions (Signed)
Thank you, Ms.Scharlene Gloss for allowing [place] to provide your care today. Today we discussed .  Low Sodium We will be rechecking your soidum levels. Please continue to not take your chlorthalidone   Diabetes Please return to recheck your A1c in 3 months. Please continue your metformin.   We will work to get your medical records from your prior doctor to determine what healthcare maintenance needs to be done.     I have ordered the following labs for you:   Lab Orders         BMP8+Anion Gap       Referrals ordered today:   Referral Orders  No referral(s) requested today     I have ordered the following medication/changed the following medications:   Stop the following medications: Medications Discontinued During This Encounter  Medication Reason   buPROPion (WELLBUTRIN XL) 300 MG 24 hr tablet    chlorthalidone (HYGROTON) 25 MG tablet    dexmethylphenidate (FOCALIN XR) 20 MG 24 hr capsule    losartan (COZAAR) 100 MG tablet    metFORMIN (GLUMETZA) 500 MG (MOD) 24 hr tablet    methylphenidate (METADATE CD) 20 MG CR capsule    pantoprazole (PROTONIX) 40 MG tablet      Start the following medications: No orders of the defined types were placed in this encounter.    Follow up: 3 months    Should you have any questions or concerns please call the internal medicine clinic at [phone].    Thalia Bloodgood, D.O. [facility]

## 2021-11-09 NOTE — Assessment & Plan Note (Signed)
Well controlled on losartan 100 mg daily. HCTZ discontinued during hospitalization as thought to be contributing to hyponatremia. Home BP readings brought in by patient within goal, one outlier of 140's systolically. Will continue on losartan for now, discussed checking blood pressure once a week and keep a recording.   Continue losartan 100 mg daily

## 2021-11-09 NOTE — Assessment & Plan Note (Signed)
Assessment: Admitted this past month for 10 days of GI losses with minimal PO intake and found to have sodium of 110. Per chart review at baseline she has a lower Na (130's) and was on chlorthalidone, etiology thought to be thiazide induced hyponatremia in the setting of GI illness. She was admitted to the ICU for monitoring of her severe hyponatremia. She has improvement with hydration and labs consistent with hypovolemic hyponatremia. Her chlorthalidone was discontinued at discharge. On follow up today her Na level is 133. Unclear why she has yet to normalize.  On review of her medication list, she is on buproprion which on review can rarely cause hyponatremia, but may be the cause. Will discuss with patient when this was started. If has recurrence, consider transitioning off of buproprion. If needs additional anti-hypertensive, consider amlodipine before diuretic.   Plan: -continue to monitor

## 2021-11-10 ENCOUNTER — Encounter: Payer: Self-pay | Admitting: Student

## 2021-11-10 LAB — BMP8+ANION GAP
Anion Gap: 16 mmol/L (ref 10.0–18.0)
BUN/Creatinine Ratio: 14 (ref 12–28)
BUN: 12 mg/dL (ref 8–27)
CO2: 20 mmol/L (ref 20–29)
Calcium: 9.2 mg/dL (ref 8.7–10.3)
Chloride: 97 mmol/L (ref 96–106)
Creatinine, Ser: 0.87 mg/dL (ref 0.57–1.00)
Glucose: 93 mg/dL (ref 70–99)
Potassium: 4.7 mmol/L (ref 3.5–5.2)
Sodium: 133 mmol/L — ABNORMAL LOW (ref 134–144)
eGFR: 74 mL/min/{1.73_m2} (ref >59)

## 2021-11-10 NOTE — Progress Notes (Signed)
Internal Medicine Clinic Attending  Case discussed with Dr. Katsadouros  At the time of the visit.  We reviewed the resident's history and exam and pertinent patient test results.  I agree with the assessment, diagnosis, and plan of care documented in the resident's note.  

## 2021-11-10 NOTE — Progress Notes (Signed)
CC: hospital follow up - hypovolemic hyponatremia  HPI:  [name] is a 66 y.o. female living with a history stated below and presents today for hospital follow up for hypovolemic hyponatremia. Please see problem based assessment and plan for additional details.  Past Medical History:  Diagnosis Date   ADD (attention deficit disorder)    Atopic dermatitis    Congenital dilation of aortic arch    Diabetes mellitus without complication (HCC)    DOE (dyspnea on exertion)    DOE (dyspnea on exertion)    GERD (gastroesophageal reflux disease)    HTN (hypertension)    IFG (impaired fasting glucose)    Neuropathy    Seasonal allergies     Current Outpatient Medications on File Prior to Visit  Medication Sig Dispense Refill   buPROPion (WELLBUTRIN XL) 300 MG 24 hr tablet Take 1 tablet (300 mg total) by mouth daily. 60 tablet 2   Dexmethylphenidate HCl 40 MG CP24 Take 1 capsule by mouth every morning.     losartan (COZAAR) 100 MG tablet Take 1 tablet (100 mg total) by mouth daily. 60 tablet 2   metFORMIN (GLUCOPHAGE-XR) 500 MG 24 hr tablet Take 1 tablet (500 mg total) by mouth 2 (two) times daily. 90 tablet 3   methylphenidate (RITALIN) 20 MG tablet Take 20-40 mg by mouth daily as needed.     pantoprazole (PROTONIX) 40 MG tablet Take 1 tablet (40 mg total) by mouth daily. 60 tablet 2   No current facility-administered medications on file prior to visit.    Family History  Problem Relation Age of Onset   Cancer Mother    Cancer Father    Diabetes Father     Social History   Socioeconomic History   Marital status: Divorced    Spouse name: Not on file   Number of children: Not on file   Years of education: Not on file   Highest education level: Not on file  Occupational History   Not on file  Tobacco Use   Smoking status: Never   Smokeless tobacco: Not on file  Substance and Sexual Activity   Alcohol use: Yes    Comment: occ   Drug use: No   Sexual activity:  Not on file  Other Topics Concern   Not on file  Social History Narrative   ** Merged History Encounter **       Social Determinants of Health   Financial Resource Strain: Not on file  Food Insecurity: Not on file  Transportation Needs: Not on file  Physical Activity: Not on file  Stress: Not on file  Social Connections: Not on file  Intimate Partner Violence: Not on file    Review of Systems: ROS negative except for what is noted on the assessment and plan.  Vitals:   11/09/21 1036  BP: 129/78  Pulse: (!) 108  Temp: 98.7 F (37.1 C)  TempSrc: Oral  SpO2: 99%  Weight: 201 lb 3.2 oz (91.3 kg)  Height: 5\' 5"  (1.651 m)    Physical Exam: Constitutional: well-appearing, in no acute distress HENT: normocephalic atraumatic Eyes: conjunctiva non-erythematous Neck: supple Cardiovascular: regular rate and rhythm, no m/r/g Pulmonary/Chest: normal work of breathing on room air, lungs clear to auscultation bilaterally MSK: normal bulk and tone Neurological: alert & oriented x 3, normal gait Skin: warm and dry Psych: normal  Assessment & Plan:   Essential hypertension Well controlled on losartan 100 mg daily. HCTZ discontinued during hospitalization as thought to  be contributing to hyponatremia. Home BP readings brought in by patient within goal, one outlier of 140's systolically. Will continue on losartan for now, discussed checking blood pressure once a week and keep a recording.   Continue losartan 100 mg daily  Type II diabetes mellitus (HCC) Last A1c of 5.9% on metformin XR 500 mg BID. Continue regimen and follow up in 6 months.   Hyponatremia Assessment: Admitted this past month for 10 days of GI losses with minimal PO intake and found to have sodium of 110. Per chart review at baseline she has a lower Na (130's) and was on chlorthalidone, etiology thought to be thiazide induced hyponatremia in the setting of GI illness. She was admitted to the ICU for monitoring of  her severe hyponatremia. She has improvement with hydration and labs consistent with hypovolemic hyponatremia. Her chlorthalidone was discontinued at discharge. On follow up today her Na level is 133. Unclear why she has yet to normalize.  On review of her medication list, she is on buproprion which on review can rarely cause hyponatremia, but may be the cause. Will discuss with patient when this was started. If has recurrence, consider transitioning off of buproprion. If needs additional anti-hypertensive, consider amlodipine before diuretic.   Plan: -continue to monitor  Healthcare maintenance Patient new to establish with our clinic. Previous PCP has planned for DEXA scan, uncertain of when next colonoscopy or mammogram are due. Will have prior PCP send records.   Patient discussed with Dr. Avie Echevaria, D.O. [facility] Internal [facility], PGY-3 Phone: [phone] Date 11/10/2021 Time 7:07 AM

## 2021-11-16 ENCOUNTER — Encounter: Payer: Self-pay | Admitting: Student

## 2021-11-21 ENCOUNTER — Telehealth: Payer: Self-pay

## 2021-11-21 NOTE — Telephone Encounter (Signed)
Call from patient needs note to go back to work.  Works for [employer]  Letter has to state when she was in the hospital when she was inpatient.  Need to say that she was out of work for 2 weeks after to get fully recovered  and able to return to work to perform her duties.  Can be sent through E-Chart.  Will print at work.

## 2021-11-21 NOTE — Telephone Encounter (Signed)
Work note sent.  

## 2021-11-21 NOTE — Telephone Encounter (Signed)
Requesting a note for work, please call pt back.  

## 2021-11-30 DIAGNOSIS — F902 Attention-deficit hyperactivity disorder, combined type: Secondary | ICD-10-CM | POA: Diagnosis not present

## 2021-11-30 DIAGNOSIS — F4010 Social phobia, unspecified: Secondary | ICD-10-CM | POA: Diagnosis not present

## 2021-11-30 DIAGNOSIS — G4700 Insomnia, unspecified: Secondary | ICD-10-CM | POA: Diagnosis not present

## 2022-01-31 ENCOUNTER — Other Ambulatory Visit: Payer: Self-pay

## 2022-01-31 MED ORDER — PANTOPRAZOLE SODIUM 40 MG PO TBEC: 40.0000 mg | DELAYED_RELEASE_TABLET | Freq: Every day | ORAL | 2 refills | Status: DC

## 2022-02-24 ENCOUNTER — Encounter: Payer: Self-pay | Admitting: Internal Medicine

## 2022-02-24 ENCOUNTER — Ambulatory Visit (INDEPENDENT_AMBULATORY_CARE_PROVIDER_SITE_OTHER): Payer: Federal, State, Local not specified - PPO | Admitting: Internal Medicine

## 2022-02-24 ENCOUNTER — Other Ambulatory Visit: Payer: Self-pay | Admitting: Internal Medicine

## 2022-02-24 VITALS — BP 135/86 | HR 86 | Temp 97.5°F | Ht 65.0 in | Wt 198.8 lb

## 2022-02-24 DIAGNOSIS — E1169 Type 2 diabetes mellitus with other specified complication: Secondary | ICD-10-CM

## 2022-02-24 DIAGNOSIS — E871 Hypo-osmolality and hyponatremia: Secondary | ICD-10-CM | POA: Diagnosis not present

## 2022-02-24 DIAGNOSIS — Z23 Encounter for immunization: Secondary | ICD-10-CM | POA: Diagnosis not present

## 2022-02-24 DIAGNOSIS — I10 Essential (primary) hypertension: Secondary | ICD-10-CM

## 2022-02-24 DIAGNOSIS — Z0000 Encounter for general adult medical examination without abnormal findings: Secondary | ICD-10-CM

## 2022-02-24 DIAGNOSIS — Z7984 Long term (current) use of oral hypoglycemic drugs: Secondary | ICD-10-CM

## 2022-02-24 NOTE — Assessment & Plan Note (Signed)
Patient wanting to have sodium checked today. She has not been taking the chlorthalidone since it was discontinued. No reported symptoms. No swelling reported.   Patient currently only taking losartan. This can also impact sodium. We will plan to recheck bmp today.

## 2022-02-24 NOTE — Progress Notes (Signed)
   CC: a1c  HPI:Karina Lynch is a 66 y.o. female who presents for evaluation of a1c. Please see individual problem based A/P for details.  Patient states she scheduled appointment to have a1c, sodium, and bp checked today. She has been complaint with her meds and does not have any complaints. She was supposed to be checking bp at home but has not been doing this.   Depression, PHQ-9: Based on the patients  score we have .  Past Medical History:  Diagnosis Date   ADD (attention deficit disorder)    Atopic dermatitis    Congenital dilation of aortic arch    Depression    Diabetes mellitus without complication (HCC)    DOE (dyspnea on exertion)    DOE (dyspnea on exertion)    GERD (gastroesophageal reflux disease)    HTN (hypertension)    IFG (impaired fasting glucose)    Neuropathy    Seasonal allergies    Review of Systems:   See hpi  Physical Exam: Vitals:   02/24/22 1021 02/24/22 1120  BP: (!) 138/91 135/86  Pulse: 91 86  Temp: (!) 97.5 F (36.4 C)   TempSrc: Oral   SpO2: 100%   Weight: 198 lb 12.8 oz (90.2 kg)   Height: 5\' 5"  (1.651 m)      General: nad HEENT: Conjunctiva nl , antiicteric sclerae, moist mucous membranes, no exudate or erythema Cardiovascular: Normal rate, regular rhythm.  No murmurs, rubs, or gallops Pulmonary : Equal breath sounds, No wheezes, rales, or rhonchi Abdominal: soft, nontender,  bowel sounds present Ext: No edema in lower extremities, no tenderness to palpation of lower extremities.   Assessment & Plan:   See Encounters Tab for problem based charting.  Patient taking losartan which can impact sodium. We will check bmp today. Blood pressure otherwise well controlled and we will plan to continue her losartan. We will also check her a1c, though most recent value 5.9 and likely does not need recheck for another 6 months. Will plan to continue her metformin for now.  Patient discussed with Dr.  Saverio Danker

## 2022-02-24 NOTE — Patient Instructions (Signed)
Dear Karina Lynch,  Thank you for trusting [place] with your care today. We discussed your blood pressure, diabetes, low blood sodium, flu shot and DEXA scan.   We will check some blood work on you today. We will see you back in 6 months for a recheck of your A1c and blood pressure and sodium.

## 2022-02-24 NOTE — Assessment & Plan Note (Signed)
Patient reports she scheduled appointment to have a1c checked.   Most recent a1c 5.9 and controlled with 500 metformin BID. We will check her a1c today, but unlikely to be dramatically changed. Would plan to continue her current regimen.

## 2022-02-24 NOTE — Assessment & Plan Note (Signed)
Patient has been compliant with her losartan. No reported side effects.  She was supposed to be checking BP at home but has not been doing this.   BP controlled on current regimen. Will plan to continue losartan for now.

## 2022-02-25 LAB — BMP8+ANION GAP
Anion Gap: 18 mmol/L (ref 10.0–18.0)
BUN/Creatinine Ratio: 22 (ref 12–28)
BUN: 18 mg/dL (ref 8–27)
CO2: 21 mmol/L (ref 20–29)
Calcium: 9.9 mg/dL (ref 8.7–10.3)
Chloride: 102 mmol/L (ref 96–106)
Creatinine, Ser: 0.82 mg/dL (ref 0.57–1.00)
Glucose: 107 mg/dL — ABNORMAL HIGH (ref 70–99)
Potassium: 5.8 mmol/L — ABNORMAL HIGH (ref 3.5–5.2)
Sodium: 141 mmol/L (ref 134–144)
eGFR: 79 mL/min/{1.73_m2} (ref >59)

## 2022-02-27 NOTE — Addendum Note (Signed)
Addended by: Truddie Crumble on: 02/27/2022 10:23 AM   Modules accepted: Orders

## 2022-02-28 ENCOUNTER — Other Ambulatory Visit: Payer: Self-pay | Admitting: Internal Medicine

## 2022-02-28 DIAGNOSIS — E875 Hyperkalemia: Secondary | ICD-10-CM

## 2022-03-01 DIAGNOSIS — F4010 Social phobia, unspecified: Secondary | ICD-10-CM | POA: Diagnosis not present

## 2022-03-01 DIAGNOSIS — F902 Attention-deficit hyperactivity disorder, combined type: Secondary | ICD-10-CM | POA: Diagnosis not present

## 2022-03-01 DIAGNOSIS — G4700 Insomnia, unspecified: Secondary | ICD-10-CM | POA: Diagnosis not present

## 2022-03-02 ENCOUNTER — Other Ambulatory Visit: Payer: Self-pay | Admitting: Internal Medicine

## 2022-03-02 DIAGNOSIS — I10 Essential (primary) hypertension: Secondary | ICD-10-CM

## 2022-03-02 NOTE — Progress Notes (Signed)
Internal Medicine Clinic Attending  Case discussed with Dr. Elliot Gurney  At the time of the visit.  We reviewed the resident's history and exam and pertinent patient test results.  I agree with the assessment, diagnosis, and plan of care documented in the resident's note. Hyperkalemia (5.8) seen on BMP. Will schedule labs only visit to repeat.

## 2022-03-02 NOTE — Progress Notes (Signed)
Potassium noted to be slightly elevated on recent BMP. Will have patient return for lab only visit to recheck and ensure that it is returning to normal limits. If remains persistently elevated, may need to treat with Rx lokelma.

## 2022-03-17 ENCOUNTER — Other Ambulatory Visit (INDEPENDENT_AMBULATORY_CARE_PROVIDER_SITE_OTHER): Payer: Federal, State, Local not specified - PPO

## 2022-03-17 DIAGNOSIS — E1169 Type 2 diabetes mellitus with other specified complication: Secondary | ICD-10-CM

## 2022-03-17 DIAGNOSIS — E875 Hyperkalemia: Secondary | ICD-10-CM

## 2022-03-19 LAB — HEMOGLOBIN A1C
Est. average glucose Bld gHb Est-mCnc: 128 mg/dL
Hgb A1c MFr Bld: 6.1 % — ABNORMAL HIGH (ref 4.8–5.6)

## 2022-03-19 LAB — BASIC METABOLIC PANEL
BUN/Creatinine Ratio: 19 (ref 12–28)
BUN: 20 mg/dL (ref 8–27)
CO2: 25 mmol/L (ref 20–29)
Calcium: 9.4 mg/dL (ref 8.7–10.3)
Chloride: 98 mmol/L (ref 96–106)
Creatinine, Ser: 1.03 mg/dL — ABNORMAL HIGH (ref 0.57–1.00)
Glucose: 84 mg/dL (ref 70–99)
Potassium: 4.2 mmol/L (ref 3.5–5.2)
Sodium: 135 mmol/L (ref 134–144)
eGFR: 60 mL/min/{1.73_m2} (ref >59)

## 2022-03-27 ENCOUNTER — Other Ambulatory Visit: Payer: Federal, State, Local not specified - PPO

## 2022-05-31 DIAGNOSIS — F902 Attention-deficit hyperactivity disorder, combined type: Secondary | ICD-10-CM | POA: Diagnosis not present

## 2022-05-31 DIAGNOSIS — F4010 Social phobia, unspecified: Secondary | ICD-10-CM | POA: Diagnosis not present

## 2022-05-31 DIAGNOSIS — G4700 Insomnia, unspecified: Secondary | ICD-10-CM | POA: Diagnosis not present

## 2022-07-25 DIAGNOSIS — F902 Attention-deficit hyperactivity disorder, combined type: Secondary | ICD-10-CM | POA: Diagnosis not present

## 2022-07-25 DIAGNOSIS — G4700 Insomnia, unspecified: Secondary | ICD-10-CM | POA: Diagnosis not present

## 2022-07-25 DIAGNOSIS — F4010 Social phobia, unspecified: Secondary | ICD-10-CM | POA: Diagnosis not present

## 2022-09-11 ENCOUNTER — Other Ambulatory Visit: Payer: Self-pay

## 2022-09-12 MED ORDER — LOSARTAN POTASSIUM 100 MG PO TABS: 100.0000 mg | ORAL_TABLET | Freq: Every day | ORAL | 2 refills | Status: DC

## 2022-09-19 DIAGNOSIS — F902 Attention-deficit hyperactivity disorder, combined type: Secondary | ICD-10-CM | POA: Diagnosis not present

## 2022-09-19 DIAGNOSIS — G4700 Insomnia, unspecified: Secondary | ICD-10-CM | POA: Diagnosis not present

## 2022-09-19 DIAGNOSIS — F4010 Social phobia, unspecified: Secondary | ICD-10-CM | POA: Diagnosis not present

## 2022-10-04 ENCOUNTER — Encounter: Payer: Self-pay | Admitting: *Deleted

## 2022-10-24 ENCOUNTER — Ambulatory Visit: Payer: Federal, State, Local not specified - PPO | Admitting: Internal Medicine

## 2022-10-24 ENCOUNTER — Other Ambulatory Visit: Payer: Self-pay

## 2022-10-24 ENCOUNTER — Encounter: Payer: Self-pay | Admitting: Internal Medicine

## 2022-10-24 VITALS — BP 141/82 | HR 81 | Temp 97.7°F | Ht 65.0 in | Wt 221.1 lb

## 2022-10-24 DIAGNOSIS — F909 Attention-deficit hyperactivity disorder, unspecified type: Secondary | ICD-10-CM | POA: Diagnosis not present

## 2022-10-24 DIAGNOSIS — Z7984 Long term (current) use of oral hypoglycemic drugs: Secondary | ICD-10-CM

## 2022-10-24 DIAGNOSIS — E1169 Type 2 diabetes mellitus with other specified complication: Secondary | ICD-10-CM

## 2022-10-24 DIAGNOSIS — I10 Essential (primary) hypertension: Secondary | ICD-10-CM

## 2022-10-24 DIAGNOSIS — K219 Gastro-esophageal reflux disease without esophagitis: Secondary | ICD-10-CM

## 2022-10-24 LAB — POCT GLYCOSYLATED HEMOGLOBIN (HGB A1C): Hemoglobin A1C: 6.1 % — AB (ref 4.0–5.6)

## 2022-10-24 LAB — GLUCOSE, CAPILLARY: Glucose-Capillary: 97 mg/dL (ref 70–99)

## 2022-10-24 MED ORDER — AMLODIPINE BESYLATE 5 MG PO TABS: 5.0000 mg | ORAL_TABLET | Freq: Every day | ORAL | 3 refills | Status: DC

## 2022-10-24 NOTE — Patient Instructions (Signed)
Thank you, Ms.Scharlene Gloss for allowing [place] to provide your care today.  Blood pressure Your blood pressure was elevated today. Please continue taking losartan 100 mg daily. Start taking amlodipine 5 mg daily. Follow-up in 1 month for nurse only visit for blood pressure.  Diabetes I will call with result from A1c. I referred you to ophthalmology for diabetic eye exam.   You actually had tetanus shot in 2018, will need repeat in 2028.  I have ordered the following labs for you:   Lab Orders         Microalbumin / Creatinine Urine Ratio         Glucose, capillary         POC Hbg A1C      Referrals ordered today:    Referral Orders         Ambulatory referral to Ophthalmology      I have ordered the following medication/changed the following medications:   Stop the following medications: Medications Discontinued During This Encounter  Medication Reason   methylphenidate (RITALIN) 20 MG tablet    Dexmethylphenidate HCl 40 MG CP24    pantoprazole (PROTONIX) 40 MG tablet      Start the following medications: No orders of the defined types were placed in this encounter.    Follow up:  1 month    We look forward to seeing you next time. Please call our clinic at [phone] if you have any questions or concerns. The best time to call is Monday-Friday from 9am-4pm, but there is someone available 24/7. If after hours or the weekend, call the main hospital number and ask for the Internal Medicine Resident On-Call. If you need medication refills, please notify your pharmacy one week in advance and they will send [place] a request.   Thank you for trusting me with your care. Wishing you the best!   Rudene Christians, DO [facility]

## 2022-10-24 NOTE — Progress Notes (Signed)
Subjective:  CC: diabetes  HPI:  Karina Lynch is a 67 y.o. female with a past medical history stated below and presents today for follow-up on diabetes and hypertension. Please see problem based assessment and plan for additional details.  Past Medical History:  Diagnosis Date   ADD (attention deficit disorder)    Atopic dermatitis    Congenital dilation of aortic arch    Depression    Diabetes mellitus without complication (HCC)    DOE (dyspnea on exertion)    DOE (dyspnea on exertion)    GERD (gastroesophageal reflux disease)    HTN (hypertension)    IFG (impaired fasting glucose)    Neuropathy    Seasonal allergies     Current Outpatient Medications on File Prior to Visit  Medication Sig Dispense Refill   AZSTARYS 52.3-10.4 MG CAPS Take 1 capsule by mouth daily.     omeprazole (PRILOSEC OTC) 20 MG tablet Take 20 mg by mouth daily.     buPROPion (WELLBUTRIN XL) 300 MG 24 hr tablet Take 1 tablet (300 mg total) by mouth daily. 60 tablet 2   losartan (COZAAR) 100 MG tablet Take 1 tablet (100 mg total) by mouth daily. 60 tablet 2   metFORMIN (GLUCOPHAGE-XR) 500 MG 24 hr tablet Take 1 tablet (500 mg total) by mouth 2 (two) times daily. 90 tablet 3   No current facility-administered medications on file prior to visit.    Family History  Problem Relation Age of Onset   Cancer Mother    Cancer Father    Diabetes Father     Social History   Socioeconomic History   Marital status: Divorced    Spouse name: Not on file   Number of children: Not on file   Years of education: Not on file   Highest education level: Not on file  Occupational History   Not on file  Tobacco Use   Smoking status: Never   Smokeless tobacco: Not on file  Substance and Sexual Activity   Alcohol use: Yes    Comment: occ   Drug use: No   Sexual activity: Not on file  Other Topics Concern   Not on file  Social History Narrative   ** Merged History Encounter **       Social  Determinants of Health   Financial Resource Strain: Low Risk  (10/24/2022)   Overall Financial Resource Strain (CARDIA)    Difficulty of Paying Living Expenses: Not very hard  Food Insecurity: No Food Insecurity (10/24/2022)   Hunger Vital Sign    Worried About Running Out of Food in the Last Year: Never true    Ran Out of Food in the Last Year: Never true  Transportation Needs: No Transportation Needs (10/24/2022)   PRAPARE - Administrator, Civil Service (Medical): No    Lack of Transportation (Non-Medical): No  Physical Activity: Not on file  Stress: Not on file  Social Connections: Socially Isolated (10/24/2022)   Social Connection and Isolation Panel [NHANES]    Frequency of Communication with Friends and Family: Once a week    Frequency of Social Gatherings with Friends and Family: Once a week    Attends Religious Services: Never    Database administrator or Organizations: No    Attends Banker Meetings: Never    Marital Status: Divorced  Catering manager Violence: Not At Risk (10/24/2022)   Humiliation, Afraid, Rape, and Kick questionnaire    Fear of Current  or Ex-Partner: No    Emotionally Abused: No    Physically Abused: No    Sexually Abused: No    Review of Systems: ROS negative except for what is noted on the assessment and plan.  Objective:   Vitals:   10/24/22 1020 10/24/22 1105  BP: (!) 141/78 (!) 141/82  Pulse: 78 81  Temp: 97.7 F (36.5 C)   TempSrc: Oral   SpO2: 99%   Weight: 221 lb 1.6 oz (100.3 kg)   Height: 5\' 5"  (1.651 m)     Physical Exam: Constitutional: well-appearing  Cardiovascular: regular rate and rhythm, no m/r/g Pulmonary/Chest: normal work of breathing on room air, lungs clear to auscultation bilaterally Abdominal: soft, non-tender, non-distended MSK: normal bulk and tone   Assessment & Plan:  ADHD (attention deficit hyperactivity disorder) Follows with [facility] counseling service is prescribing  medications.  GERD (gastroesophageal reflux disease) Taking OTC omeprazole 20 mg every day with improvement in symptoms. She was having coughing that improved with PPI.  -continue omeprazole 20 mg qd  Essential hypertension Blood pressure elevated 141/78 and 141/82 on repeat. Current medications include losartan 100 mg every day which she endorsed taking this morning. She does not check her blood pressure at home. In the past she took thiazide but was admitted 7/23 with hyponatremia requiring an ICU admission. P: Continue losartan 100 mg Start amlodipine 5 mg every day F/u in 4 weeks for nurse only visit  Type II diabetes mellitus (HCC) Lab Results  Component Value Date   HGBA1C 6.1 (A) 10/24/2022  She reports adherence with metformin. A1c remains stable at 6.1 -continue metformin 500 mg BID -urine microalbumin/creatinine ratio -A1c -referral to ophthalmology  Patient discussed with Dr. Percell Boston Janely Gullickson, D.O. [facility]  PGY-2 Pager: [phone]  Phone: [phone] Date 10/25/2022  Time 8:19 AM

## 2022-10-24 NOTE — Assessment & Plan Note (Signed)
Follows with [facility] counseling service is prescribing medications.

## 2022-10-24 NOTE — Assessment & Plan Note (Signed)
Taking OTC omeprazole 20 mg every day with improvement in symptoms. She was having coughing that improved with PPI.  -continue omeprazole 20 mg qd

## 2022-10-25 LAB — MICROALBUMIN / CREATININE URINE RATIO
Creatinine, Urine: 63.7 mg/dL
Microalb/Creat Ratio: <5 mg/g creat (ref 0–29)
Microalbumin, Urine: <3 ug/mL

## 2022-10-25 NOTE — Assessment & Plan Note (Signed)
Lab Results  Component Value Date   HGBA1C 6.1 (A) 10/24/2022  She reports adherence with metformin. A1c remains stable at 6.1 -continue metformin 500 mg BID -urine microalbumin/creatinine ratio -A1c -referral to ophthalmology

## 2022-10-25 NOTE — Assessment & Plan Note (Signed)
Blood pressure elevated 141/78 and 141/82 on repeat. Current medications include losartan 100 mg every day which she endorsed taking this morning. She does not check her blood pressure at home. In the past she took thiazide but was admitted 7/23 with hyponatremia requiring an ICU admission. P: Continue losartan 100 mg Start amlodipine 5 mg every day F/u in 4 weeks for nurse only visit

## 2022-10-26 ENCOUNTER — Encounter: Payer: Self-pay | Admitting: Internal Medicine

## 2022-11-06 NOTE — Progress Notes (Signed)
Internal Medicine Clinic Attending  Case discussed with Dr. Masters  at the time of the visit.  We reviewed the resident's history and exam and pertinent patient test results.  I agree with the assessment, diagnosis, and plan of care documented in the resident's note.  

## 2022-11-16 DIAGNOSIS — F902 Attention-deficit hyperactivity disorder, combined type: Secondary | ICD-10-CM | POA: Diagnosis not present

## 2022-11-16 DIAGNOSIS — F4010 Social phobia, unspecified: Secondary | ICD-10-CM | POA: Diagnosis not present

## 2022-11-16 DIAGNOSIS — G4700 Insomnia, unspecified: Secondary | ICD-10-CM | POA: Diagnosis not present

## 2022-11-27 ENCOUNTER — Ambulatory Visit: Payer: Federal, State, Local not specified - PPO | Admitting: *Deleted

## 2022-11-27 NOTE — Progress Notes (Signed)
    Karina Lynch presented today for blood pressure check. Patient is prescribed blood pressure medications and I confirmed that patient did take their blood pressure medication prior to today's appointment. Blood pressure was taken in the usual and appropriate manner using an automated BP cuff.     Vitals:   11/27/22 1139  BP: 131/69    Patient has home BP monitoring device. Patient instructed on most accurate way to take BP, to keep BP log with date and time and how she is feeling and bring log to all future visits.   Results of today's visit will be routed to Dr. Sloan Leiter for review and further management.   Karina Lynch, BSN, RN-BC

## 2022-12-19 ENCOUNTER — Other Ambulatory Visit: Payer: Self-pay

## 2022-12-19 DIAGNOSIS — I10 Essential (primary) hypertension: Secondary | ICD-10-CM

## 2023-03-11 ENCOUNTER — Other Ambulatory Visit: Payer: Self-pay | Admitting: Student

## 2023-03-12 MED ORDER — LOSARTAN POTASSIUM 100 MG PO TABS: 100.0000 mg | ORAL_TABLET | Freq: Every day | ORAL | 2 refills | Status: DC

## 2023-07-04 ENCOUNTER — Ambulatory Visit: Payer: Self-pay | Admitting: Student

## 2023-07-04 NOTE — Telephone Encounter (Signed)
 Copied from CRM [id number]. Topic: Clinical - Red Word Triage >> Jul 04, 2023  3:31 PM Alcus Dad H wrote: [facility] that prompted transfer to Nurse Triage: weakness and shortness of breath, going on for weeks and has gotten worse in the last 2 weeks  Chief Complaint: SOB Symptoms: SOB, weakness Frequency: Ongoing for a couple weeks Disposition: [x] ED /[] Urgent Care (no appt availability in office) / [] Appointment(In office/virtual)/ []  [facility]/ [] Home Care/ [] Refused Recommended Disposition /[] [facility]/ []  Follow-up with PCP Additional Notes: Patient stated that she has been having SOB for the past couple weeks and she feels like it is worsening now. She has slight SOB at rest. She is very short of breath while walking short distances. It is difficult for her to move from one chair to the next or to walk from one room to another in her house. Instructed patient to go to ED for evaluation. Patient verbalized understanding.   Reason for Disposition  [1] MODERATE difficulty breathing (e.g., speaks in phrases, SOB even at rest, pulse 100-120) AND [2] NEW-onset or WORSE than normal  Answer Assessment - Initial Assessment Questions 1. RESPIRATORY STATUS: "Describe your breathing?" (e.g., wheezing, shortness of breath, unable to speak, severe coughing)      SOB  2. ONSET: "When did this breathing problem begin?"      At least 2 weeks ago and it is worsening now  3. PATTERN "Does the difficult breathing come and go, or has it been constant since it started?"      Usually all day   4. SEVERITY: "How bad is your breathing?" (e.g., mild, moderate, severe)    - MILD: No SOB at rest, mild SOB with walking, speaks normally in sentences, can lie down, no retractions, pulse < 100.    - MODERATE: SOB at rest, SOB with minimal exertion and prefers to sit, cannot lie down flat, speaks in phrases, mild retractions, audible wheezing, pulse 100-120.    - SEVERE: Very SOB at rest, speaks in  single words, struggling to breathe, sitting hunched forward, retractions, pulse > 120      Slight SOB at rest. Very SOB when walking short distances, walking to bathroom or walking from one room to the next, going up stairs, moving from chair to chair.   5. RECURRENT SYMPTOM: "Have you had difficulty breathing before?" If Yes, ask: "When was the last time?" and "What happened that time?"      Patient stated the last time she was having a lot of SOB, she ended up in ICU  6. LUNG HISTORY: "Do you have any history of lung disease?"  (e.g., pulmonary embolus, asthma, emphysema)     Had breathing problems for a few years  7. OTHER SYMPTOMS: "Do you have any other symptoms? (e.g., dizziness, runny nose, cough, chest pain, fever)      Feeling weak  Protocols used: Breathing Difficulty-A-AH

## 2023-07-05 ENCOUNTER — Ambulatory Visit: Admitting: Student

## 2023-07-05 VITALS — BP 103/44 | HR 93 | Temp 98.1°F | Ht 65.0 in | Wt 202.9 lb

## 2023-07-05 DIAGNOSIS — Z0000 Encounter for general adult medical examination without abnormal findings: Secondary | ICD-10-CM

## 2023-07-05 DIAGNOSIS — E785 Hyperlipidemia, unspecified: Secondary | ICD-10-CM | POA: Diagnosis not present

## 2023-07-05 DIAGNOSIS — E1169 Type 2 diabetes mellitus with other specified complication: Secondary | ICD-10-CM | POA: Diagnosis not present

## 2023-07-05 DIAGNOSIS — Z7984 Long term (current) use of oral hypoglycemic drugs: Secondary | ICD-10-CM

## 2023-07-05 DIAGNOSIS — I10 Essential (primary) hypertension: Secondary | ICD-10-CM | POA: Diagnosis not present

## 2023-07-05 DIAGNOSIS — E2839 Other primary ovarian failure: Secondary | ICD-10-CM

## 2023-07-05 DIAGNOSIS — R0609 Other forms of dyspnea: Secondary | ICD-10-CM | POA: Diagnosis not present

## 2023-07-05 LAB — GLUCOSE, CAPILLARY: Glucose-Capillary: 116 mg/dL — ABNORMAL HIGH (ref 70–99)

## 2023-07-05 LAB — POCT GLYCOSYLATED HEMOGLOBIN (HGB A1C): Hemoglobin A1C: 6.2 % — AB (ref 4.0–5.6)

## 2023-07-05 MED ORDER — METFORMIN HCL ER 500 MG PO TB24: 500.0000 mg | ORAL_TABLET | Freq: Two times a day (BID) | ORAL | 3 refills | Status: DC

## 2023-07-05 NOTE — Progress Notes (Signed)
 CC: Follow-up  HPI:  [name] is a 68 y.o. female living with a history stated below and presents today for follow-up. Please see problem based assessment and plan for additional details.  Past Medical History:  Diagnosis Date   ADD (attention deficit disorder)    Atopic dermatitis    Congenital dilation of aortic arch    Depression    Diabetes mellitus without complication (HCC)    DOE (dyspnea on exertion)    DOE (dyspnea on exertion)    GERD (gastroesophageal reflux disease)    HTN (hypertension)    IFG (impaired fasting glucose)    Neuropathy    Seasonal allergies     Current Outpatient Medications on File Prior to Visit  Medication Sig Dispense Refill   amLODipine (NORVASC) 5 MG tablet Take 1 tablet (5 mg total) by mouth daily. 90 tablet 3   AZSTARYS 52.3-10.4 MG CAPS Take 1 capsule by mouth daily.     buPROPion (WELLBUTRIN XL) 300 MG 24 hr tablet Take 1 tablet (300 mg total) by mouth daily. 60 tablet 2   losartan (COZAAR) 100 MG tablet Take 1 tablet (100 mg total) by mouth daily. 60 tablet 2   omeprazole (PRILOSEC OTC) 20 MG tablet Take 20 mg by mouth daily.     No current facility-administered medications on file prior to visit.    Family History  Problem Relation Age of Onset   Cancer Mother    Cancer Father    Diabetes Father     Social History   Socioeconomic History   Marital status: Divorced    Spouse name: Not on file   Number of children: Not on file   Years of education: Not on file   Highest education level: Not on file  Occupational History   Not on file  Tobacco Use   Smoking status: Never   Smokeless tobacco: Not on file  Substance and Sexual Activity   Alcohol use: Yes    Comment: occ   Drug use: No   Sexual activity: Not on file  Other Topics Concern   Not on file  Social History Narrative   ** Merged History Encounter **       Social Drivers of Health   Financial Resource Strain: Low Risk  (10/24/2022)   Overall  Financial Resource Strain (CARDIA)    Difficulty of Paying Living Expenses: Not very hard  Food Insecurity: No Food Insecurity (10/24/2022)   Hunger Vital Sign    Worried About Running Out of Food in the Last Year: Never true    Ran Out of Food in the Last Year: Never true  Transportation Needs: No Transportation Needs (10/24/2022)   PRAPARE - Administrator, Civil Service (Medical): No    Lack of Transportation (Non-Medical): No  Physical Activity: Not on file  Stress: Not on file  Social Connections: Socially Isolated (10/24/2022)   Social Connection and Isolation Panel [NHANES]    Frequency of Communication with Friends and Family: Once a week    Frequency of Social Gatherings with Friends and Family: Once a week    Attends Religious Services: Never    Database administrator or Organizations: No    Attends Banker Meetings: Never    Marital Status: Divorced  Catering manager Violence: Not At Risk (10/24/2022)   Humiliation, Afraid, Rape, and Kick questionnaire    Fear of Current or Ex-Partner: No    Emotionally Abused: No    Physically  Abused: No    Sexually Abused: No    Review of Systems: ROS negative except for what is noted on the assessment and plan.  Vitals:   07/05/23 1028  BP: (!) 103/44  Pulse: 93  Temp: 98.1 F (36.7 C)  TempSrc: Oral  SpO2: 100%  Weight: 202 lb 14.4 oz (92 kg)  Height: 5\' 5"  (1.651 m)    Physical Exam: Constitutional: well-appearing, sitting in chair, in no acute distress Cardiovascular: regular rate and rhythm, no m/r/g Pulmonary/Chest: normal work of breathing on room air, lungs clear to auscultation bilaterally Abdominal: soft, non-tender, non-distended MSK: normal bulk and tone Skin: warm and dry Psych: normal mood and behavior  Assessment & Plan:     Patient discussed with Dr. Sol Blazing  Type II diabetes mellitus (HCC) A1c today is 6.2. She had not been taking her prescribed Metformin 500 BID because she did  not think she needed it after last A1c 6.1.  -Refilled Metformin and counseled on diet/exercise -BMP -Ophthalmology referral   Dyspnea on exertion Has a longstanding history of this. Endorses recent worsening but states she feels it is largely due to being out of shape. Cardiology saw her for this in 2023 and was reassured by TTE and nuclear stress test. She states symptoms are similar to then. She denies chest pain. Initial BP soft but orthostatics negative. See plan for HTN.  Healthcare maintenance -Bone density -Hep. C screening  Hyperlipidemia associated with type 2 diabetes mellitus (HCC) LDL was 125 in 2023 above goal for primary prevention. -Repeat Lipid panel  Essential hypertension BP 103/44. She will take ambulatory readings and return 3/20 for BP check. She was instructed to hold Amlodipine if SBP is below 100.   [name], D.O. [facility], PGY-1 Phone: [phone] Date 07/09/2023 Time 3:51 PM

## 2023-07-05 NOTE — Patient Instructions (Signed)
 Check your blood pressure twice per day and write the readings down on the log provided. Bring this and your blood pressure cuff to the nurse visit in 2 weeks. If the first number is consistently less than 100 then please stop taking your Amlodipine and give [place] a call.  Resume your Metformin twice per day.

## 2023-07-05 NOTE — Telephone Encounter (Signed)
 I called and spoke with the patient regarding her SOB. I offered her a appointment, she has been scheduled to come in this morning at 10:15 to see Dr.Ingold.

## 2023-07-07 LAB — LIPID PANEL
Chol/HDL Ratio: 3 ratio (ref 0.0–4.4)
Cholesterol, Total: 186 mg/dL (ref 100–199)
HDL: 63 mg/dL (ref >39)
LDL Chol Calc (NIH): 103 mg/dL — ABNORMAL HIGH (ref 0–99)
Triglycerides: 111 mg/dL (ref 0–149)
VLDL Cholesterol Cal: 20 mg/dL (ref 5–40)

## 2023-07-07 LAB — BASIC METABOLIC PANEL
BUN/Creatinine Ratio: 12 (ref 12–28)
BUN: 14 mg/dL (ref 8–27)
CO2: 17 mmol/L — ABNORMAL LOW (ref 20–29)
Calcium: 8.8 mg/dL (ref 8.7–10.3)
Chloride: 101 mmol/L (ref 96–106)
Creatinine, Ser: 1.17 mg/dL — ABNORMAL HIGH (ref 0.57–1.00)
Glucose: 117 mg/dL — ABNORMAL HIGH (ref 70–99)
Potassium: 5 mmol/L (ref 3.5–5.2)
Sodium: 136 mmol/L (ref 134–144)
eGFR: 51 mL/min/{1.73_m2} — ABNORMAL LOW (ref >59)

## 2023-07-07 LAB — HCV INTERPRETATION

## 2023-07-07 LAB — HCV AB W REFLEX TO QUANT PCR: HCV Ab: NONREACTIVE

## 2023-07-09 DIAGNOSIS — E1169 Type 2 diabetes mellitus with other specified complication: Secondary | ICD-10-CM | POA: Insufficient documentation

## 2023-07-09 NOTE — Assessment & Plan Note (Signed)
 BP 103/44. She will take ambulatory readings and return 3/20 for BP check. She was instructed to hold Amlodipine if SBP is below 100.

## 2023-07-09 NOTE — Assessment & Plan Note (Signed)
-  Bone density -Hep. C screening

## 2023-07-09 NOTE — Assessment & Plan Note (Signed)
 Has a longstanding history of this. Endorses recent worsening but states she feels it is largely due to being out of shape. Cardiology saw her for this in 2023 and was reassured by TTE and nuclear stress test. She states symptoms are similar to then. She denies chest pain. Initial BP soft but orthostatics negative. See plan for HTN.

## 2023-07-09 NOTE — Assessment & Plan Note (Signed)
 LDL was 125 in 2023 above goal for primary prevention. -Repeat Lipid panel

## 2023-07-09 NOTE — Assessment & Plan Note (Addendum)
 A1c today is 6.2. She had not been taking her prescribed Metformin 500 BID because she did not think she needed it after last A1c 6.1.  -Refilled Metformin and counseled on diet/exercise -BMP -Ophthalmology referral

## 2023-07-10 NOTE — Progress Notes (Signed)
 Internal Medicine Clinic Attending  Case discussed with the resident at the time of the visit.  We reviewed the resident's history and exam and pertinent patient test results.  I agree with the assessment, diagnosis, and plan of care documented in the resident's note.

## 2023-07-11 ENCOUNTER — Ambulatory Visit: Payer: Self-pay | Admitting: Student

## 2023-07-11 ENCOUNTER — Observation Stay (HOSPITAL_COMMUNITY)
Admission: EM | Admit: 2023-07-11 | Discharge: 2023-07-13 | Disposition: A | Discharge status: Home / Self Care | Attending: Internal Medicine | Admitting: Internal Medicine

## 2023-07-11 ENCOUNTER — Other Ambulatory Visit: Payer: Self-pay

## 2023-07-11 ENCOUNTER — Encounter (HOSPITAL_COMMUNITY): Payer: Self-pay | Admitting: *Deleted

## 2023-07-11 DIAGNOSIS — R0602 Shortness of breath: Secondary | ICD-10-CM | POA: Diagnosis present

## 2023-07-11 DIAGNOSIS — Z7984 Long term (current) use of oral hypoglycemic drugs: Secondary | ICD-10-CM | POA: Insufficient documentation

## 2023-07-11 DIAGNOSIS — D500 Iron deficiency anemia secondary to blood loss (chronic): Secondary | ICD-10-CM | POA: Diagnosis not present

## 2023-07-11 DIAGNOSIS — E119 Type 2 diabetes mellitus without complications: Secondary | ICD-10-CM | POA: Diagnosis not present

## 2023-07-11 DIAGNOSIS — R8289 Other abnormal findings on cytological and histological examination of urine: Secondary | ICD-10-CM | POA: Diagnosis not present

## 2023-07-11 DIAGNOSIS — Z79899 Other long term (current) drug therapy: Secondary | ICD-10-CM | POA: Insufficient documentation

## 2023-07-11 DIAGNOSIS — F909 Attention-deficit hyperactivity disorder, unspecified type: Secondary | ICD-10-CM | POA: Diagnosis not present

## 2023-07-11 DIAGNOSIS — D509 Iron deficiency anemia, unspecified: Secondary | ICD-10-CM | POA: Diagnosis present

## 2023-07-11 DIAGNOSIS — E785 Hyperlipidemia, unspecified: Secondary | ICD-10-CM | POA: Insufficient documentation

## 2023-07-11 DIAGNOSIS — R7989 Other specified abnormal findings of blood chemistry: Secondary | ICD-10-CM | POA: Diagnosis not present

## 2023-07-11 DIAGNOSIS — I10 Essential (primary) hypertension: Secondary | ICD-10-CM | POA: Insufficient documentation

## 2023-07-11 DIAGNOSIS — D649 Anemia, unspecified: Principal | ICD-10-CM | POA: Diagnosis present

## 2023-07-11 DIAGNOSIS — N3000 Acute cystitis without hematuria: Secondary | ICD-10-CM | POA: Insufficient documentation

## 2023-07-11 DIAGNOSIS — D75839 Thrombocytosis, unspecified: Secondary | ICD-10-CM | POA: Insufficient documentation

## 2023-07-11 LAB — CBC WITH DIFFERENTIAL/PLATELET
Abs Immature Granulocytes: 0.14 10*3/uL — ABNORMAL HIGH (ref 0.00–0.07)
Basophils Absolute: 0.1 10*3/uL (ref 0.0–0.1)
Basophils Relative: 0 %
Eosinophils Absolute: 0.1 10*3/uL (ref 0.0–0.5)
Eosinophils Relative: 1 %
HCT: 23 % — ABNORMAL LOW (ref 36.0–46.0)
Hemoglobin: 6.7 g/dL — CL (ref 12.0–15.0)
Immature Granulocytes: 1 %
Lymphocytes Relative: 14 %
Lymphs Abs: 1.5 10*3/uL (ref 0.7–4.0)
MCH: 19.4 pg — ABNORMAL LOW (ref 26.0–34.0)
MCHC: 29.1 g/dL — ABNORMAL LOW (ref 30.0–36.0)
MCV: 66.5 fL — ABNORMAL LOW (ref 80.0–100.0)
Monocytes Absolute: 0.8 10*3/uL (ref 0.1–1.0)
Monocytes Relative: 7 %
Neutro Abs: 8.6 10*3/uL — ABNORMAL HIGH (ref 1.7–7.7)
Neutrophils Relative %: 77 %
Platelets: 536 10*3/uL — ABNORMAL HIGH (ref 150–400)
RBC: 3.46 MIL/uL — ABNORMAL LOW (ref 3.87–5.11)
RDW: 16 % — ABNORMAL HIGH (ref 11.5–15.5)
WBC: 11.2 10*3/uL — ABNORMAL HIGH (ref 4.0–10.5)
nRBC: 0 % (ref 0.0–0.2)

## 2023-07-11 LAB — URINALYSIS, W/ REFLEX TO CULTURE (INFECTION SUSPECTED)
Bilirubin Urine: NEGATIVE
Glucose, UA: NEGATIVE mg/dL
Hgb urine dipstick: NEGATIVE
Ketones, ur: NEGATIVE mg/dL
Nitrite: NEGATIVE
Protein, ur: NEGATIVE mg/dL
Specific Gravity, Urine: 1.018 (ref 1.005–1.030)
WBC, UA: >50 WBC/hpf (ref 0–5)
pH: 5 (ref 5.0–8.0)

## 2023-07-11 LAB — COMPREHENSIVE METABOLIC PANEL
ALT: 8 U/L (ref 0–44)
AST: 14 U/L — ABNORMAL LOW (ref 15–41)
Albumin: 3.7 g/dL (ref 3.5–5.0)
Alkaline Phosphatase: 87 U/L (ref 38–126)
Anion gap: 11 (ref 5–15)
BUN: 26 mg/dL — ABNORMAL HIGH (ref 8–23)
CO2: 20 mmol/L — ABNORMAL LOW (ref 22–32)
Calcium: 9.2 mg/dL (ref 8.9–10.3)
Chloride: 102 mmol/L (ref 98–111)
Creatinine, Ser: 1.39 mg/dL — ABNORMAL HIGH (ref 0.44–1.00)
GFR, Estimated: 42 mL/min — ABNORMAL LOW (ref >60)
Glucose, Bld: 114 mg/dL — ABNORMAL HIGH (ref 70–99)
Potassium: 4.3 mmol/L (ref 3.5–5.1)
Sodium: 133 mmol/L — ABNORMAL LOW (ref 135–145)
Total Bilirubin: 0.6 mg/dL (ref 0.0–1.2)
Total Protein: 7.4 g/dL (ref 6.5–8.1)

## 2023-07-11 LAB — POC OCCULT BLOOD, ED: Fecal Occult Bld: NEGATIVE

## 2023-07-11 LAB — PREPARE RBC (CROSSMATCH)

## 2023-07-11 LAB — ABO/RH: ABO/RH(D): A NEG

## 2023-07-11 MED ORDER — SODIUM CHLORIDE 0.9 % IV SOLN
1.0000 g | Freq: Once | INTRAVENOUS | Status: AC
  Administered 2023-07-11: 1 g via INTRAVENOUS
  Filled 2023-07-11: qty 10

## 2023-07-11 NOTE — ED Provider Notes (Signed)
 [place] EMERGENCY DEPARTMENT AT [facility] Provider Note   CSN: [id number] Arrival date & time: 07/11/23  1747     History {Add pertinent medical, surgical, social history, OB history to HPI:1} Chief Complaint  Patient presents with   Hypotension    NOVALIE LEAMY is a 68 y.o. female who is here for fatigue and sob x2 weeks. Pateint went to IM and was seen . She has been monitoring her BP at home and her BP was low she was instructed to come in to the ED. Denies melena or hematochezia. No blood thinners. Increased urinary frequency.  HPI     Home Medications Prior to Admission medications   Medication Sig Start Date End Date Taking? Authorizing Provider  amLODipine (NORVASC) 5 MG tablet Take 1 tablet (5 mg total) by mouth daily. 10/24/22 10/24/23  Masters, Katie, DO  AZSTARYS 52.3-10.4 MG CAPS Take 1 capsule by mouth daily. 10/05/22   [provider]  buPROPion (WELLBUTRIN XL) 300 MG 24 hr tablet Take 1 tablet (300 mg total) by mouth daily. 11/05/21   Steffanie Rainwater, MD  losartan (COZAAR) 100 MG tablet Take 1 tablet (100 mg total) by mouth daily. 03/12/23 05/11/23  Faith Rogue, DO  metFORMIN (GLUCOPHAGE-XR) 500 MG 24 hr tablet Take 1 tablet (500 mg total) by mouth 2 (two) times daily. 07/05/23   [name], DO  omeprazole (PRILOSEC OTC) 20 MG tablet Take 20 mg by mouth daily.    [provider]      Allergies    Patient has no known allergies.    Review of Systems   Review of Systems  Physical Exam Updated Vital Signs BP (!) 157/82 (BP Location: Right Arm)   Pulse 94   Temp (!) 97.5 F (36.4 C) (Oral)   Resp 17   Ht 5\' 5"  (1.651 m)   Wt 92 kg   SpO2 98%   BMI 33.75 kg/m  Physical Exam  ED Results / Procedures / Treatments   Labs (all labs ordered are listed, but only abnormal results are displayed) Labs Reviewed  COMPREHENSIVE METABOLIC PANEL - Abnormal; Notable for the following components:      Result Value   Sodium 133 (*)     CO2 20 (*)    Glucose, Bld 114 (*)    BUN 26 (*)    Creatinine, Ser 1.39 (*)    AST 14 (*)    GFR, Estimated 42 (*)    All other components within normal limits  CBC WITH DIFFERENTIAL/PLATELET - Abnormal; Notable for the following components:   WBC 11.2 (*)    RBC 3.46 (*)    Hemoglobin 6.7 (*)    HCT 23.0 (*)    MCV 66.5 (*)    MCH 19.4 (*)    MCHC 29.1 (*)    RDW 16.0 (*)    Platelets 536 (*)    Neutro Abs 8.6 (*)    Abs Immature Granulocytes 0.14 (*)    All other components within normal limits  URINALYSIS, W/ REFLEX TO CULTURE (INFECTION SUSPECTED) - Abnormal; Notable for the following components:   APPearance CLOUDY (*)    Leukocytes,Ua MODERATE (*)    Bacteria, UA MANY (*)    All other components within normal limits  URINE CULTURE  POC OCCULT BLOOD, ED    EKG None  Radiology No results found.  Procedures .Critical Care  Performed by: Arthor Captain, PA-C Authorized by: Arthor Captain, PA-C   Critical care provider statement:  Critical care time (minutes):  30   Critical care time was exclusive of:  Separately billable procedures and treating other patients   Critical care was time spent personally by me on the following activities:  Development of treatment plan with patient or surrogate, discussions with consultants, evaluation of patient's response to treatment, examination of patient, ordering and review of laboratory studies, ordering and review of radiographic studies, ordering and performing treatments and interventions, pulse oximetry, re-evaluation of patient's condition and review of old charts   {Document cardiac monitor, telemetry assessment procedure when appropriate:1}  Medications Ordered in ED Medications - No data to display  ED Course/ Medical Decision Making/ A&P Clinical Course as of 07/11/23 2259  Wed Jul 11, 2023  2237 Bacteria, UA(!): MANY [AH]  2237 WBC, UA: >50 [AH]  2238 WBC(!): 11.2 [AH]  2238 Hemoglobin(!!): 6.7 [AH]     Clinical Course User Index [AH] Arthor Captain, PA-C   {   Click here for ABCD2, HEART and other calculatorsREFRESH Note before signing :1}                              Medical Decision Making  ***  {Document critical care time when appropriate:1} {Document review of labs and clinical decision tools ie heart score, Chads2Vasc2 etc:1}  {Document your independent review of radiology images, and any outside records:1} {Document your discussion with family members, caretakers, and with consultants:1} {Document social determinants of health affecting pt's care:1} {Document your decision making why or why not admission, treatments were needed:1} Final Clinical Impression(s) / ED Diagnoses Final diagnoses:  None    Rx / DC Orders ED Discharge Orders     None

## 2023-07-11 NOTE — H&P (Incomplete)
 Date: 07/12/2023               Patient Name:  Karina Lynch MRN: [id number]  DOB: [birth year] Age / Sex: 68 y.o., female   PCP: Faith Rogue, DO         Medical Service: Internal Medicine Teaching Service         Attending Physician: Dr. Mercie Eon, MD      First Contact: Dr. [name], DO Pager [phone]    Second Contact: Dr. Marrianne Mood, MD Pager [phone]         After Hours (After 5p/  First Contact Pager: [phone]  weekends / holidays): Second Contact Pager: [phone]   SUBJECTIVE   Chief Complaint: SOB, weakness   History of Present Illness:  Karina Lynch is a 68 yo female with history of HTN, T2DM, GERD who presented with weakness and shortness of breath. Patient reports her symptoms have been worsening for the past several weeks. She becomes very dyspneic and weak with minimal exertion such as taking a shower. No shortness of breath at rest. She went to the [facility] on 3/6 with similar symptoms and was found to be borderline hypotensive with negative orthostatics. She was advised to check her BP at home and this morning after she got out of the shower, she was feeling very winded and fatigued. BP at home was in the 80s systolic. She went to urgent care where her BP was even lower and she was sent to the ED.   She has been dieting for the last ~5 months for a cruise with intentional 20lb weight loss. She was consuming cottage cheese, tuna, chicken. She has gained back 5lbs since resuming her normal diet.   She reports a single episode of dizziness and queasiness. She regularly has urinary frequency. No change in po intake. Denies NSAID or Goody powder use. Her last bowel movement was earlier today. Denies melena, hematochezia, gum bleeding, hematuria, dysuria, vaginal bleeding, chest pain, abdominal pain, night sweats, pica.  ED Course: Afeb, HDS with BP 120-150s systolic, not tachycardic Hgb 6.7, MCV 66 UA significant for leukocytes  Meds:   Dexmethylphenidate Wellbutrin 300 mg daily Amlodipine 5 mg - held today Losartan 100 mg daily - held today  Metformin 500 mg BID Omeprazole 20 mg daily   Past Medical History  Past Surgical History:  Procedure Laterality Date   orif left 5th finger     righr ulnar nerve tumor excision      Social:  Lives alone Occupation: retired  Support: family (2 sisters, daughter) Level of Function: independent of ADL, iADLs PCP: Faith Rogue, DO Substances: does not drink alcohol regularly (last drank when she was on a cruise last month), no tobacco, no illicit substance use   Family History: reviewed, no pertinent updates  Allergies: Allergies as of 07/11/2023   (No Known Allergies)   Review of Systems: A complete ROS was negative except as per HPI.   OBJECTIVE:   Physical Exam: Blood pressure 133/76, pulse 97, temperature (!) 97.5 F (36.4 C), temperature source Oral, resp. rate (!) 22, height 5\' 5"  (1.651 m), weight 92 kg, SpO2 100%.   Constitutional: appears well, no acute distress HENT: normocephalic atraumatic, mucous membranes moist Eyes: conjunctiva non-erythematous Cardiovascular: regular rate and rhythm, no m/r/g. Appears well-perfused, warm extremities Pulmonary/Chest: normal work of breathing on room air, lungs clear to auscultation bilaterally Abdominal: soft, non-tender, non-distended MSK: normal bulk and tone Neurological: alert & oriented x 3, 5/5 strength in bilateral  upper and lower extremities Skin: warm and dry Psych: normal mood and affect  Labs: CBC    Component Value Date/Time   WBC 11.2 (H) 07/11/2023 1837   RBC 3.46 (L) 07/11/2023 1837   HGB 6.7 (LL) 07/11/2023 1837   HCT 23.0 (L) 07/11/2023 1837   PLT 536 (H) 07/11/2023 1837   MCV 66.5 (L) 07/11/2023 1837   MCH 19.4 (L) 07/11/2023 1837   MCHC 29.1 (L) 07/11/2023 1837   RDW 16.0 (H) 07/11/2023 1837   LYMPHSABS 1.5 07/11/2023 1837   MONOABS 0.8 07/11/2023 1837   EOSABS 0.1 07/11/2023 1837    BASOSABS 0.1 07/11/2023 1837     CMP     Component Value Date/Time   NA 133 (L) 07/11/2023 1837   NA 136 07/05/2023 1155   K 4.3 07/11/2023 1837   CL 102 07/11/2023 1837   CO2 20 (L) 07/11/2023 1837   GLUCOSE 114 (H) 07/11/2023 1837   BUN 26 (H) 07/11/2023 1837   BUN 14 07/05/2023 1155   CREATININE 1.39 (H) 07/11/2023 1837   CALCIUM 9.2 07/11/2023 1837   PROT 7.4 07/11/2023 1837   ALBUMIN 3.7 07/11/2023 1837   AST 14 (L) 07/11/2023 1837   ALT 8 07/11/2023 1837   ALKPHOS 87 07/11/2023 1837   BILITOT 0.6 07/11/2023 1837   GFRNONAA 42 (L) 07/11/2023 1837    ASSESSMENT & PLAN:   Assessment & Plan by Problem: Principal Problem:   Symptomatic anemia  SHIR BERGMAN is a 68 y.o. person living with a history of HTN, T2DM, GERD who presented with weakness and admitted for anemia on hospital day 0  Microcytic anemia Hx iron deficiency Thrombocytosis Hgb 6.7, MCV 66.5. Platelets 536. FOBT negative. Alk phos normal. Tbili 0.8. Last hemoglobin from 2023 was normal. No evidence of bleeding from history or physical exam. She had a colonoscopy in 2018 that she thinks was unremarkable, report is not available. She was noted to be iron deficient during an admission in 2023 but was not anemic at that time. Low evidence for hemolysis as etiology of anemia, however without other obvious source for anemia we will get further workup for hemolysis.  - S/p 1 unit pRBCs in the ED - Ferritin, iron panel, haptoglobin, LDH, reticulocyte panel pending - Continue home omeprazole 20mg   Asymptomatic pyuria UA significant for leukocytes. Patient denies dysuria. She reports chronic urinary frequency. She has had UTIs before but does not feel any similar symptoms to previous episodes. Ceftriaxone was given in the ED, however she does not have an indication for further antibiotics.   Hypertension Though she reports low blood pressure prior to arrival to the ED, BP has been high/normal since arrival. Her  creatinine is slightly higher than baseline. Will hold home amlodipine and losartan at this time.   Elevated creatinine Creatinine 1.39 from 1.17 when most recently checked.  - Holding home losartan and amlodipine  ADHD Continue home buproprion 300mg , holding home Azstarys (Serdexmethylphenidate/dexmethylphenidate)  T2DM Holding home metformin   Diet: Normal VTE: SCDs IVF: None,None Code: Full  Prior to Admission Living Arrangement: Home, living alone Anticipated Discharge Location: Home Barriers to Discharge: Anemia  Dispo: Admit patient to Observation with expected length of stay less than 2 midnights.  Signed: Monna Fam, MD Internal Medicine Resident PGY-1  07/12/2023, 1:13 AM

## 2023-07-11 NOTE — H&P (Incomplete)
 Date: 07/11/2023               Patient Name:  Karina Lynch MRN: [id number]  DOB: February 04, 1956 Age / Sex: 68 y.o., female   PCP: Faith Rogue, DO         Medical Service: Internal Medicine Teaching Service         Attending Physician: Dr. Coral Spikes, DO      First Contact: Dr. [name], DO Pager [phone]    Second Contact: Dr. Marrianne Mood, MD Pager [phone]         After Hours (After 5p/  First Contact Pager: [phone]  weekends / holidays): Second Contact Pager: [phone]   SUBJECTIVE   Chief Complaint: SOB, weakness   History of Present Illness: Karina Lynch is a 68 yo female with hypertension, T2DM, GERD, and HLD who presented with weakness and shortness of breath x 2 weeks.  She denies shortness of breath at rest but states that she gets very dyspneic and weak with minimal exertion - even taking a shower causes her to be short of breath. *** She went to the [facility] on 3/6 with similar symptoms and was found to be hypotensive - she was recommended to stop her antihypertensives. She was advised to check her BP at home and this morning after she got out of the shower, she was feeling very winded and SBP was in the high 80s. She was then recommended to go to the urgent care. At [facility], her BP was even lower and she was sent to the ED.   She denies any dizziness  No melena, hematochezia  ED Course:  Meds:  Azstarys 1 capsule daily  Amlodipine 5 mg - has been holding Wellbutrin 300 mg daily Losartan 100 mg daily - has been holding Metformin 500 mg BID Omeprazole 20 mg daily   No outpatient medications have been marked as taking for the 07/11/23 encounter [facility] Encounter).    Past Medical History  Past Surgical History:  Procedure Laterality Date  . orif left 5th finger    . righr ulnar nerve tumor excision      Social:  Lives With: Occupation: Support: family  Level of Function: independent of ADL, iADLs PCP: [facility]  Substances:  Family History:  ***  Allergies: Allergies as of 07/11/2023  . (No Known Allergies)    Review of Systems: A complete ROS was negative except as per HPI.   OBJECTIVE:   Physical Exam: Blood pressure 133/76, pulse 97, temperature (!) 97.5 F (36.4 C), temperature source Oral, resp. rate (!) 22, height 5\' 5"  (1.651 m), weight 92 kg, SpO2 100%.  Constitutional: well-appearing *** sitting in ***, in no acute distress HENT: normocephalic atraumatic, mucous membranes moist Eyes: conjunctiva non-erythematous Neck: supple Cardiovascular: regular rate and rhythm, no m/r/g Pulmonary/Chest: normal work of breathing on room air, lungs clear to auscultation bilaterally Abdominal: soft, non-tender, non-distended MSK: normal bulk and tone Neurological: alert & oriented x 3, 5/5 strength in bilateral upper and lower extremities, normal gait Skin: warm and dry Psych: ***  Labs: CBC    Component Value Date/Time   WBC 11.2 (H) 07/11/2023 1837   RBC 3.46 (L) 07/11/2023 1837   HGB 6.7 (LL) 07/11/2023 1837   HCT 23.0 (L) 07/11/2023 1837   PLT 536 (H) 07/11/2023 1837   MCV 66.5 (L) 07/11/2023 1837   MCH 19.4 (L) 07/11/2023 1837   MCHC 29.1 (L) 07/11/2023 1837   RDW 16.0 (H) 07/11/2023 1837   LYMPHSABS  1.5 07/11/2023 1837   MONOABS 0.8 07/11/2023 1837   EOSABS 0.1 07/11/2023 1837   BASOSABS 0.1 07/11/2023 1837     CMP     Component Value Date/Time   NA 133 (L) 07/11/2023 1837   NA 136 07/05/2023 1155   K 4.3 07/11/2023 1837   CL 102 07/11/2023 1837   CO2 20 (L) 07/11/2023 1837   GLUCOSE 114 (H) 07/11/2023 1837   BUN 26 (H) 07/11/2023 1837   BUN 14 07/05/2023 1155   CREATININE 1.39 (H) 07/11/2023 1837   CALCIUM 9.2 07/11/2023 1837   PROT 7.4 07/11/2023 1837   ALBUMIN 3.7 07/11/2023 1837   AST 14 (L) 07/11/2023 1837   ALT 8 07/11/2023 1837   ALKPHOS 87 07/11/2023 1837   BILITOT 0.6 07/11/2023 1837   GFRNONAA 42 (L) 07/11/2023 1837    Imaging:  EKG: personally reviewed my interpretation  is***. Prior EKG***  ASSESSMENT & PLAN:   Assessment & Plan by Problem: Active Problems:   * No active hospital problems. *   Karina Lynch is a 68 y.o. person living with a history of *** who presented with *** and admitted for *** on hospital day 0  *** ***  *** ***  *** ***  Diet: {NAMES:3044014::"Normal","Heart Healthy","Carb-Modified","Renal","Carb/Renal","NPO","TPN","Tube Feeds"} VTE: {NAMES:3044014::"Heparin","Enoxaparin","SCDs","DOAC","None"} IVF: {NAMES:3044014::"None","NS","1/2 NS","LR","D5","D10"},{NAMES:3044014::"None","10cc/hr","25cc/hr","50cc/hr","75cc/hr","100cc/hr","110cc/hr","125cc/hr","Bolus"} Code: {NAMES:3044014::"Full","DNR","DNI","DNR/DNI","Comfort Care","Unknown"}  Prior to Admission Living Arrangement: {NAMES:3044014::"Home, living ***","SNF, ***","Homeless","***"} Anticipated Discharge Location: {NAMES:3044014::"Home","SNF","CIR","***"} Barriers to Discharge: ***  Dispo: Admit patient to {STATUS:3044014::"Observation with expected length of stay less than 2 midnights.","Inpatient with expected length of stay greater than 2 midnights."}  Signed: Chauncey Mann, DO Internal Medicine Resident PGY-3  07/11/2023, 11:47 PM

## 2023-07-11 NOTE — ED Triage Notes (Signed)
 The second bp was a standing bp

## 2023-07-11 NOTE — ED Provider Triage Note (Signed)
 Emergency Medicine Provider Triage Evaluation Note  Karina Lynch , a 68 y.o. female  was evaluated in triage.  Pt complains of hypotension.  Review of Systems  Positive:  Negative:   Physical Exam  BP 121/73   Pulse 97   Temp 97.8 F (36.6 C)   Resp 18   Ht 5\' 5"  (1.651 m)   Wt 92 kg   SpO2 98%   BMI 33.75 kg/m  Gen:   Awake, no distress   Resp:  Normal effort  MSK:   Moves extremities without difficulty  Other:    Medical Decision Making  Medically screening exam initiated at 6:35 PM.  Appropriate orders placed.  Karina Lynch was informed that the remainder of the evaluation will be completed by another provider, this initial triage assessment does not replace that evaluation, and the importance of remaining in the ED until their evaluation is complete.  Concerned for hypotension, generalized weakness, nausea, and urinary frequency. No other infectious symptoms. No chest pain or abdominal pain.  BP looks good here in triage.   Karina Lynch, [place] 07/11/23 1836

## 2023-07-11 NOTE — ED Triage Notes (Signed)
 The pt takes bp med and for the last several days  her bp has been  lower  earlier today her  was in the 80s  feeling weak

## 2023-07-11 NOTE — ED Notes (Signed)
 Repeated pts vs. Pt ambulatory in lobby. Currently seating in designated zone d/t pts increase in acuity. Pt continues to wait on room availability.

## 2023-07-11 NOTE — Telephone Encounter (Signed)
 Chief Complaint: SOB Symptoms: SOB on exertion, fatigue, weakness, burning in her muscles, nausea Frequency: SOB for years, worse now Pertinent Negatives: Patient denies CP, palpitations, syncope, dizziness/lightheadedness, fever, diaphoresis Disposition: [] ED /[x] Urgent Care (no appt availability in office) / [] Appointment(In office/virtual)/ []  [place] [facility]/ [] Home Care/ [] Refused Recommended Disposition /[] [place] [facility]/ []  Follow-up with PCP Additional Notes: Pt calls in today reporting multiple symptoms. Pt reports SOB on exertion, fatigue, muscle weakness with a burning sensation, and nausea. Pt states she has had SOB on exertion for years but states it has been much worse lately. Pt seen on 3/6 in the office. Additionally, pt reports an episode this AM where she felt very nauseous, weak, and fatigued. Pt states she checked her BP and the systolic number was 89. She rechecked it and the systolic number was 105. Pt states her BP is usually not that low. Pt takes amlodipine and losartan but did not take the amlodipine today. Pt's BP was soft at 103/44 at her 3/6 office visit. At the time of that episode, pt denies she had CP, SOB, dizziness/lightheadedness, palpitations, diaphoresis. States she feels much better now except for mild nausea. Additionally, pt reports muscle pain and burning especially in her thighs and with walking. Pt states often times these symptoms have been chalked up to her weight but pt states that she feels something is not right. RN advised pt she should be seen within 4 hours for protocol for SOB on exertion. No availability in the office during that time frame. RN advised pt go to UC and gave pt the address of the nearest address care and wait times. Pt agreeable.RN advised pt that if she becomes SOB at rest or develops CP, dizziness, or lightheadedness she needs to not drive and call [phone] to which she verbalized understanding.   Copied from CRM [id number].  Topic: Clinical - Red Word Triage >> Jul 11, 2023  2:11 PM Brittney F wrote: [facility] that prompted transfer to Nurse Triage: Patient's stated that she had SOB, fatigue, muscle weakness, thigh muscles burn and nausea Reason for Disposition  [1] MILD difficulty breathing (e.g., minimal/no SOB at rest, SOB with walking, pulse <100) AND [2] NEW-onset or WORSE than normal  Answer Assessment - Initial Assessment Questions 1. RESPIRATORY STATUS: "Describe your breathing?" (e.g., wheezing, shortness of breath, unable to speak, severe coughing)      SOB 2. 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 States SOB is much worse now than it was when it started. 3. PATTERN "Does the difficult breathing come and go, or has it been constant since it started?"      Comes and goes, worse with exertion 4. SEVERITY: "How bad is your breathing?" (e.g., mild, moderate, severe)    - MILD: No SOB at rest, mild SOB with walking, speaks normally in sentences, can lie down, no retractions, pulse < 100.    - MODERATE: SOB at rest, SOB with minimal exertion and prefers to sit, cannot lie down flat, speaks in phrases, mild retractions, audible wheezing, pulse 100-120.    - SEVERE: Very SOB at rest,  speaks in single words, struggling to breathe, sitting hunched forward, retractions, pulse > 120      SOB on exertion, endorses "heavy breathing" sometimes at rest  5. RECURRENT SYMPTOM: "Have you had difficulty breathing before?" If Yes, ask: "When was the last time?" and "What happened that time?"      Couple years 6. CARDIAC HISTORY: "Do you have any history of heart disease?" (e.g., heart attack, angina,  bypass surgery, angioplasty)      HTN - taking amlodipine and losartan  7. LUNG HISTORY: "Do you have any history of lung disease?"  (e.g., pulmonary embolus, asthma, emphysema)     No 8. CAUSE: "What do you think is causing the breathing problem?"      Not sure, wonders if it is her BP meds, states she feels worse taking these  9. OTHER SYMPTOMS: "Do you have any other symptoms? (e.g., dizziness, runny nose, cough, chest pain, fever)     Muscle weakness and burning in the thighs, fatigue, nausea. Low BP (taking BP meds - states top number was 89, took it again and it was 105, endorses nausea with that and that happened at 8AM today), "I couldn't do anything except for sit down - nauseous, fatigue, couldn't walk." States she is feeling a touch nauseous. Denied CP (but does endorse tightness and "constricted" with inspiration), SOB, palpitations, lightheadedness, diaphoresis during that time. Pt states these issues have been attributed to her weight in the past. 3/6 office visit BP was 103/44. Did not take amlodipine today, states she dropped the pill. Endorses muscle burning in whatever muscle she is using. Burning with walking. Has concerns about her abnormal labs. 10. O2 SATURATION MONITOR:  "Do you use an oxygen saturation monitor (pulse oximeter) at home?" If Yes, ask: "What is your reading (oxygen level) today?" "What is your usual oxygen saturation reading?" (e.g., 95%)       No, does not check - was 100 on 3/6  Protocols used: Breathing Difficulty-A-AH

## 2023-07-11 NOTE — ED Triage Notes (Signed)
 The pt called a hgb of 6.7  triage pa notified  acuity increased to a 2

## 2023-07-12 ENCOUNTER — Other Ambulatory Visit: Payer: Self-pay | Admitting: Student

## 2023-07-12 DIAGNOSIS — D500 Iron deficiency anemia secondary to blood loss (chronic): Secondary | ICD-10-CM

## 2023-07-12 DIAGNOSIS — D649 Anemia, unspecified: Principal | ICD-10-CM | POA: Diagnosis present

## 2023-07-12 DIAGNOSIS — D509 Iron deficiency anemia, unspecified: Secondary | ICD-10-CM | POA: Diagnosis present

## 2023-07-12 LAB — IRON AND TIBC
Iron: 9 ug/dL — ABNORMAL LOW (ref 28–170)
Saturation Ratios: 2 % — ABNORMAL LOW (ref 10.4–31.8)
TIBC: 601 ug/dL — ABNORMAL HIGH (ref 250–450)
UIBC: 592 ug/dL

## 2023-07-12 LAB — LACTATE DEHYDROGENASE: LDH: 162 U/L (ref 98–192)

## 2023-07-12 LAB — TECHNOLOGIST SMEAR REVIEW

## 2023-07-12 LAB — FERRITIN: Ferritin: 2 ng/mL — ABNORMAL LOW (ref 11–307)

## 2023-07-12 LAB — CBC
HCT: 23.3 % — ABNORMAL LOW (ref 36.0–46.0)
Hemoglobin: 7.2 g/dL — ABNORMAL LOW (ref 12.0–15.0)
MCH: 20.9 pg — ABNORMAL LOW (ref 26.0–34.0)
MCHC: 30.9 g/dL (ref 30.0–36.0)
MCV: 67.5 fL — ABNORMAL LOW (ref 80.0–100.0)
Platelets: 419 10*3/uL — ABNORMAL HIGH (ref 150–400)
RBC: 3.45 MIL/uL — ABNORMAL LOW (ref 3.87–5.11)
RDW: 17.6 % — ABNORMAL HIGH (ref 11.5–15.5)
WBC: 10.8 10*3/uL — ABNORMAL HIGH (ref 4.0–10.5)
nRBC: 0 % (ref 0.0–0.2)

## 2023-07-12 LAB — RETIC PANEL
Immature Retic Fract: 43.3 % — ABNORMAL HIGH (ref 2.3–15.9)
RBC.: 3.46 MIL/uL — ABNORMAL LOW (ref 3.87–5.11)
Retic Count, Absolute: 52.2 10*3/uL (ref 19.0–186.0)
Retic Ct Pct: 1.5 % (ref 0.4–3.1)
Reticulocyte Hemoglobin: 16.1 pg — ABNORMAL LOW (ref >27.9)

## 2023-07-12 LAB — BASIC METABOLIC PANEL
Anion gap: 7 (ref 5–15)
BUN: 24 mg/dL — ABNORMAL HIGH (ref 8–23)
CO2: 23 mmol/L (ref 22–32)
Calcium: 9.1 mg/dL (ref 8.9–10.3)
Chloride: 106 mmol/L (ref 98–111)
Creatinine, Ser: 1.24 mg/dL — ABNORMAL HIGH (ref 0.44–1.00)
GFR, Estimated: 48 mL/min — ABNORMAL LOW (ref >60)
Glucose, Bld: 110 mg/dL — ABNORMAL HIGH (ref 70–99)
Potassium: 4 mmol/L (ref 3.5–5.1)
Sodium: 136 mmol/L (ref 135–145)

## 2023-07-12 LAB — HEMOGLOBIN AND HEMATOCRIT, BLOOD
HCT: 22.6 % — ABNORMAL LOW (ref 36.0–46.0)
Hemoglobin: 7 g/dL — ABNORMAL LOW (ref 12.0–15.0)

## 2023-07-12 LAB — HIV ANTIBODY (ROUTINE TESTING W REFLEX): HIV Screen 4th Generation wRfx: NONREACTIVE

## 2023-07-12 LAB — PREPARE RBC (CROSSMATCH)

## 2023-07-12 MED ORDER — IRON SUCROSE 500 MG IVPB - SIMPLE MED
200.0000 mg | Freq: Once | INTRAVENOUS | Status: DC
  Filled 2023-07-12: qty 275

## 2023-07-12 MED ORDER — SODIUM CHLORIDE 0.9 % IV SOLN
200.0000 mg | Freq: Once | INTRAVENOUS | Status: AC
  Administered 2023-07-12: 200 mg via INTRAVENOUS
  Filled 2023-07-12: qty 10

## 2023-07-12 MED ORDER — SODIUM CHLORIDE 0.9 % IV SOLN
300.0000 mg | Freq: Once | INTRAVENOUS | Status: AC
  Administered 2023-07-12: 300 mg via INTRAVENOUS
  Filled 2023-07-12: qty 15

## 2023-07-12 MED ORDER — IRON SUCROSE 500 MG IVPB - SIMPLE MED: 200.0000 mg | Freq: Once | INTRAVENOUS | Status: DC

## 2023-07-12 MED ORDER — IRON SUCROSE 200 MG IVPB - SIMPLE MED
200.0000 mg | Freq: Once | Status: DC
  Filled 2023-07-12: qty 110

## 2023-07-12 MED ORDER — ACETAMINOPHEN 650 MG RE SUPP: 650.0000 mg | Freq: Four times a day (QID) | RECTAL | Status: DC | PRN

## 2023-07-12 MED ORDER — FERROUS SULFATE 325 (65 FE) MG PO TBEC: 325.0000 mg | DELAYED_RELEASE_TABLET | ORAL | 0 refills | Status: DC

## 2023-07-12 MED ORDER — BUPROPION HCL ER (XL) 150 MG PO TB24
300.0000 mg | ORAL_TABLET | Freq: Every day | ORAL | Status: DC
  Administered 2023-07-12 – 2023-07-13 (×2): 300 mg via ORAL
  Filled 2023-07-12 (×2): qty 2

## 2023-07-12 MED ORDER — IRON SUCROSE 200 MG IVPB - SIMPLE MED: 200.0000 mg | Freq: Once | Status: DC

## 2023-07-12 MED ORDER — PANTOPRAZOLE SODIUM 40 MG PO TBEC
40.0000 mg | DELAYED_RELEASE_TABLET | Freq: Every day | ORAL | Status: DC
  Administered 2023-07-12 – 2023-07-13 (×2): 40 mg via ORAL
  Filled 2023-07-12 (×2): qty 1

## 2023-07-12 MED ORDER — ACETAMINOPHEN 325 MG PO TABS: 650.0000 mg | ORAL_TABLET | Freq: Four times a day (QID) | ORAL | Status: DC | PRN

## 2023-07-12 MED ORDER — SODIUM CHLORIDE 0.9% IV SOLUTION: Freq: Once | INTRAVENOUS | Status: AC

## 2023-07-12 NOTE — Discharge Summary (Signed)
 Name: Karina Lynch MRN: [id number] DOB: [birth year] 68 y.o. PCP: Faith Rogue, DO  Date of Admission: 07/11/2023  6:14 PM Date of Discharge:  07/13/2023 Attending Physician: Dr.  Mercie Eon  DISCHARGE DIAGNOSIS:  Primary Problem: Symptomatic anemia   Hospital Problems: Principal Problem:   Symptomatic anemia Active Problems:   Iron deficiency anemia due to chronic blood loss    DISCHARGE MEDICATIONS:   Allergies as of 07/13/2023   No Known Allergies      Medication List     PAUSE taking these medications    amLODipine 5 MG tablet Wait to take this until your doctor or other care provider tells you to start again. Or the first number of your blood pressure is consistently > 130. Commonly known as: NORVASC Take 1 tablet (5 mg total) by mouth daily.   losartan 100 MG tablet Wait to take this until your doctor or other care provider tells you to start again. Commonly known as: COZAAR Take 1 tablet (100 mg total) by mouth daily.       TAKE these medications    buPROPion 300 MG 24 hr tablet Commonly known as: WELLBUTRIN XL Take 1 tablet (300 mg total) by mouth daily.   Dexmethylphenidate HCl 40 MG Cp24 Take 1 capsule by mouth every morning.   ferrous sulfate 325 (65 FE) MG EC tablet Take 1 tablet (325 mg total) by mouth every other day. Avoid coffee/tea/citrus fruits or juices when taking iron to help with absorption.   metFORMIN 500 MG 24 hr tablet Commonly known as: GLUCOPHAGE-XR Take 1 tablet (500 mg total) by mouth 2 (two) times daily.   omeprazole 20 MG tablet Commonly known as: PRILOSEC OTC Take 20 mg by mouth daily.   rosuvastatin 10 MG tablet Commonly known as: Crestor Take 1 tablet (10 mg total) by mouth daily.   VITAMIN D-VITAMIN K PO Take 1 tablet by mouth daily.        DISPOSITION AND FOLLOW-UP:  Karina Lynch was discharged from [facility] in Stable condition. At the hospital follow up visit please  address:  Symptomatic IDA -CBC -Ensure patient has GI appointment -Sister also has severe IDA, consider TMPRSS6 genetic testing for iron-refractory iron deficiency anemia if GI does not find source of IDA.  Elevated Creatinine -BMP   HTN Losartan and Amlodipine held on discharge. Consider resuming if appropriate  HLD As determined by labs from [facility] prior to admission. ASCVD risk is 10.7% but patient's diabetes is well-controlled so will hold off on high intensity dose for now. Crestor 10 started at discharge. Assess for tolerance.    HOSPITAL COURSE:  Patient Summary:  Karina Lynch is a 68 y.o. female with PMH of HTN, T2DM, GERD who presented with weakness and shortness of breath and admitted for symptomatic IDA.   Symptomatic IDA Hgb on admission was 6.7.  Workup revealed severe iron deficiency anemia with, per Ganzoni equation, iron deficit of ~ 1.9 g. She received a total of 2 units PRBC's and 1.2 g of IV iron. Her Hgb improved to 8.3 on day of discharge and her symptoms were markedly improved. She was able to tolerate ambulation without complication.  Source of IDA remains unclear. She denied signs of blood loss. Interestingly, per patient, her sister had a very similar experience a few years ago with severe IDA requiring transfusion. Her sister was worked up by GI and follows with hemonc. but patient unsure of official diagnosis. Her sister gets scheduled iron  transfusions. Unclear if this is related, but iron-refractory iron deficiency anemia is on the differential considering this. Outpatient GI referral placed in addition to being send home with po iron.  Elevated Creatinine 1.39 on admission with suspected prerenal etiology given above. Baseline ~ 0.8. It improved during admission and was 1.05 on day of discharge. -Trend BMP   HTN Hypotensive on admission so home Losartan and Amlodipine were held. These were also held on discharge until follow-up  DISCHARGE INSTRUCTIONS:    Discharge Instructions     Call MD for:  difficulty breathing, headache or visual disturbances   Complete by: As directed    Call MD for:  extreme fatigue   Complete by: As directed    Call MD for:  hives   Complete by: As directed    Call MD for:  persistant dizziness or light-headedness   Complete by: As directed    Call MD for:  persistant nausea and vomiting   Complete by: As directed    Call MD for:  redness, tenderness, or signs of infection (pain, swelling, redness, odor or green/yellow discharge around incision site)   Complete by: As directed    Call MD for:  severe uncontrolled pain   Complete by: As directed    Call MD for:  temperature >100.4   Complete by: As directed    Discharge instructions   Complete by: As directed    You were admitted to the hospital for weakness and shortness of breath that was caused by iron deficiency anemia.  You received blood and iron and your symptoms have improved.  Importantly, your hemoglobin which is a marker for anemia, has also improved.  It is unclear why you are iron deficient so we have placed a gastroenterology referral for you to follow-up to further investigate this.  We have also made a follow-up appointment for you at the internal medicine center on 3/20 at 10:45 with Dr. Ninfa Meeker.  In the meantime, we will start you on iron to be taken every other day.  Generally it is best to avoid coffee or citrus fruits/juices when taking iron to help with better absorption.  We have also started you on a medication called rosuvastatin which is a medication to help lower your cholesterol.  Cholesterol is not related to your iron deficiency anemia - This was just started as your cholesterol was elevated when we checked your labs at the internal medicine center last week.  We feel confident that you are stable for discharge, but please return to the emergency department if you experience significant weakness or shortness of breath.  Please hold off on  taking your blood pressure medications until you follow-up at the internal medicine center.  However, given that you have a blood pressure cuff, if your blood pressure is continually elevated (first number consistently > 130) then you can take your Amlodipine.   Increase activity slowly   Complete by: As directed        SUBJECTIVE:  SOB/weakness much improved. Comfortable with discharge Discharge Vitals:   BP 107/62 (BP Location: Left Arm)   Pulse 91   Temp 97.6 F (36.4 C) (Oral)   Resp 18   Ht 5\' 5"  (1.651 m)   Wt 92 kg   SpO2 92%   BMI 33.75 kg/m   OBJECTIVE:   Physical Exam: Constitutional: well-appearing, lying in bed, in no acute distress Eyes: mild conjunctival pallor Cardiovascular: regular rate and rhythm, no m/r/g, no LEE, capillary refill < 2 sec  Pulmonary/Chest: normal work of breathing on room air, lungs clear to auscultation bilaterally Skin: warm and dry   Pertinent Labs, Studies, and Procedures:     Latest Ref Rng & Units 07/13/2023    7:45 AM 07/12/2023    1:02 PM 07/12/2023    4:53 AM  CBC  WBC 4.0 - 10.5 K/uL 9.7   10.8   Hemoglobin 12.0 - 15.0 g/dL 8.3  7.0  7.2   Hematocrit 36.0 - 46.0 % 27.4  22.6  23.3   Platelets 150 - 400 K/uL 444   419        Latest Ref Rng & Units 07/13/2023    7:45 AM 07/12/2023    4:53 AM 07/11/2023    6:37 PM  CMP  Glucose 70 - 99 mg/dL 161  096  045   BUN 8 - 23 mg/dL 15  24  26    Creatinine 0.44 - 1.00 mg/dL 4.09  8.11  9.14   Sodium 135 - 145 mmol/L 137  136  133   Potassium 3.5 - 5.1 mmol/L 3.5  4.0  4.3   Chloride 98 - 111 mmol/L 106  106  102   CO2 22 - 32 mmol/L 20  23  20    Calcium 8.9 - 10.3 mg/dL 8.8  9.1  9.2   Total Protein 6.5 - 8.1 g/dL   7.4   Total Bilirubin 0.0 - 1.2 mg/dL   0.6   Alkaline Phos 38 - 126 U/L   87   AST 15 - 41 U/L   14   ALT 0 - 44 U/L   8     Signed: [name], DO  Internal Medicine Resident, PGY-1 [facility]  Pager: [phone] 11:49 AM,  07/13/2023

## 2023-07-12 NOTE — Plan of Care (Signed)
  Problem: Education: Goal: Knowledge of General Education information will improve Description: Including pain rating scale, medication(s)/side effects and non-pharmacologic comfort measures 07/12/2023 1952 by Rudean Curt, RN Outcome: Progressing 07/12/2023 1700 by Rudean Curt, RN Outcome: Progressing   Problem: Health Behavior/Discharge Planning: Goal: Ability to manage health-related needs will improve 07/12/2023 1952 by Rudean Curt, RN Outcome: Progressing 07/12/2023 1700 by Rudean Curt, RN Outcome: Progressing   Problem: Clinical Measurements: Goal: Ability to maintain clinical measurements within normal limits will improve 07/12/2023 1952 by Rudean Curt, RN Outcome: Progressing 07/12/2023 1700 by Rudean Curt, RN Outcome: Progressing Goal: Will remain free from infection 07/12/2023 1952 by Rudean Curt, RN Outcome: Progressing 07/12/2023 1700 by Rudean Curt, RN Outcome: Progressing Goal: Diagnostic test results will improve 07/12/2023 1952 by Rudean Curt, RN Outcome: Progressing 07/12/2023 1700 by Rudean Curt, RN Outcome: Progressing Goal: Respiratory complications will improve 07/12/2023 1952 by Rudean Curt, RN Outcome: Progressing 07/12/2023 1700 by Rudean Curt, RN Outcome: Progressing Goal: Cardiovascular complication will be avoided 07/12/2023 1952 by Rudean Curt, RN Outcome: Progressing 07/12/2023 1700 by Rudean Curt, RN Outcome: Progressing   Problem: Activity: Goal: Risk for activity intolerance will decrease 07/12/2023 1952 by Rudean Curt, RN Outcome: Progressing 07/12/2023 1700 by Rudean Curt, RN Outcome: Progressing   Problem: Nutrition: Goal: Adequate nutrition will be maintained 07/12/2023 1952 by Rudean Curt, RN Outcome: Progressing 07/12/2023 1700 by Rudean Curt, RN Outcome: Progressing   Problem: Coping: Goal: Level of anxiety will  decrease 07/12/2023 1952 by Rudean Curt, RN Outcome: Progressing 07/12/2023 1700 by Rudean Curt, RN Outcome: Progressing   Problem: Elimination: Goal: Will not experience complications related to bowel motility 07/12/2023 1952 by Rudean Curt, RN Outcome: Progressing 07/12/2023 1700 by Rudean Curt, RN Outcome: Progressing Goal: Will not experience complications related to urinary retention 07/12/2023 1952 by Rudean Curt, RN Outcome: Progressing 07/12/2023 1700 by Rudean Curt, RN Outcome: Progressing   Problem: Pain Managment: Goal: General experience of comfort will improve and/or be controlled 07/12/2023 1952 by Rudean Curt, RN Outcome: Progressing 07/12/2023 1700 by Rudean Curt, RN Outcome: Progressing   Problem: Safety: Goal: Ability to remain free from injury will improve 07/12/2023 1952 by Rudean Curt, RN Outcome: Progressing 07/12/2023 1700 by Rudean Curt, RN Outcome: Progressing   Problem: Skin Integrity: Goal: Risk for impaired skin integrity will decrease 07/12/2023 1952 by Rudean Curt, RN Outcome: Progressing 07/12/2023 1700 by Rudean Curt, RN Outcome: Progressing

## 2023-07-12 NOTE — ED Notes (Addendum)
 Blood bank notified provider only wants pt to receive one unit of blood per Dr. Ned Card via secure chat.

## 2023-07-12 NOTE — Progress Notes (Signed)
 Subjective Weakness and SOB have improved, denies signs of blood loss.  Physical exam Blood pressure 104/65, pulse (!) 105, temperature 98 F (36.7 C), temperature source Oral, resp. rate 18, height 5\' 5"  (1.651 m), weight 92 kg, SpO2 97%.  Physical Exam: Constitutional: well-appearing, lying in bed, in no acute distress Cardiovascular: regular rate and rhythm, no m/r/g, no LEE, capillary refill < 2 sec Pulmonary/Chest: normal work of breathing on room air, lungs clear to auscultation bilaterally Abdominal: soft, non-tender, non-distended Skin: warm and dry Psych: normal mood and behavior   Weight change:    Intake/Output Summary (Last 24 hours) at 07/12/2023 1647 Last data filed at 07/12/2023 3664 Gross per 24 hour  Intake 741.91 ml  Output --  Net 741.91 ml   Net IO Since Admission: 741.91 mL [07/12/23 1647]  Labs, images, and other studies    Latest Ref Rng & Units 07/12/2023    1:02 PM 07/12/2023    4:53 AM 07/11/2023    6:37 PM  CBC  WBC 4.0 - 10.5 K/uL  10.8  11.2   Hemoglobin 12.0 - 15.0 g/dL 7.0  7.2  6.7   Hematocrit 36.0 - 46.0 % 22.6  23.3  23.0   Platelets 150 - 400 K/uL  419  536        Latest Ref Rng & Units 07/12/2023    4:53 AM 07/11/2023    6:37 PM 07/05/2023   11:55 AM  BMP  Glucose 70 - 99 mg/dL 403  474  259   BUN 8 - 23 mg/dL 24  26  14    Creatinine 0.44 - 1.00 mg/dL 5.63  8.75  6.43   BUN/Creat Ratio 12 - 28   12   Sodium 135 - 145 mmol/L 136  133  136   Potassium 3.5 - 5.1 mmol/L 4.0  4.3  5.0   Chloride 98 - 111 mmol/L 106  102  101   CO2 22 - 32 mmol/L 23  20  17    Calcium 8.9 - 10.3 mg/dL 9.1  9.2  8.8      Assessment and plan Hospital day 1  Karina Lynch is a 68 y.o.HTN, T2DM, GERD who presented with weakness and shortness of breath admitted for symptomatic IDA.  Symptomatic IDA HDS. Hgb improved to 7.2 early this morning s/p 1 unit PRBC's. Iron studies consistent with severe iron deficiency. Deficit per Loel Ro  equation is ~ 1.9 g. She has received ~ 1 g from Venofer infusions and PRBC's since admission. Repeat Hgb this afternoon was 7.0. Will proceed with additional unit and plan on discharge tomorrow assuming Hgb is stable and she is able to tolerate ambulation which has been improving. Source of IDA remains unclear. Interestingly, per patient, her sister had a very similar experience a few years ago with severe IDA requiring transfusion. Her sister was worked up by GI and follows with hemonc. but patient unsure of official diagnosis. Her sister gets scheduled iron transfusions. Unclear if this is related but iron-refractory iron deficiency anemia is on the differential considering this new information. She is an [facility] patient and we will schedule close follow-up. Outpatient GI referral has been placed. -Trend CBC and transfuse for Hgb < 7 -Consider TMPRSS6 gene testing outpatient   Elevated Creatinine Improved today 1.39 > 1.24. Suspect this is pre-renal given hypotension on admission. Holding Losartan. Encouraged po hydration. -Trend BMP  HTN BP stable.  Holding home Losartan and Amlodipine 2/2 above  ADHD Holding home Dexmethylphenidate given elevated creatinine.   Depression Continue home Wellbutrin   T2DM A1c 3//2025 was 6.2. CBG stable. Holding home metformin  Diet: Regular VTE: SCDs Start: 07/12/23 0029  Code: Full  Karina Miller, DO 07/12/2023, 4:47 PM  Pager: [phone] After 5pm or weekend: [phone]

## 2023-07-12 NOTE — Plan of Care (Signed)

## 2023-07-13 ENCOUNTER — Other Ambulatory Visit (HOSPITAL_COMMUNITY): Payer: Self-pay

## 2023-07-13 DIAGNOSIS — D500 Iron deficiency anemia secondary to blood loss (chronic): Principal | ICD-10-CM

## 2023-07-13 LAB — BASIC METABOLIC PANEL
Anion gap: 11 (ref 5–15)
BUN: 15 mg/dL (ref 8–23)
CO2: 20 mmol/L — ABNORMAL LOW (ref 22–32)
Calcium: 8.8 mg/dL — ABNORMAL LOW (ref 8.9–10.3)
Chloride: 106 mmol/L (ref 98–111)
Creatinine, Ser: 1.05 mg/dL — ABNORMAL HIGH (ref 0.44–1.00)
GFR, Estimated: 58 mL/min — ABNORMAL LOW (ref >60)
Glucose, Bld: 143 mg/dL — ABNORMAL HIGH (ref 70–99)
Potassium: 3.5 mmol/L (ref 3.5–5.1)
Sodium: 137 mmol/L (ref 135–145)

## 2023-07-13 LAB — TYPE AND SCREEN
ABO/RH(D): A NEG
Antibody Screen: NEGATIVE
Unit division: 0
Unit division: 0

## 2023-07-13 LAB — BPAM RBC
Blood Product Expiration Date: 202503202359
Blood Product Unit Number: 202503192359
ISSUE DATE / TIME: 202503130029
PRODUCT CODE: 202503131639
PRODUCT CODE: 202503202359
Unit Type and Rh: 202503192359
Unit Type and Rh: 600
Unit Type and Rh: 600
Unit Type and Rh: 600

## 2023-07-13 LAB — CBC
HCT: 27.4 % — ABNORMAL LOW (ref 36.0–46.0)
Hemoglobin: 8.3 g/dL — ABNORMAL LOW (ref 12.0–15.0)
MCH: 21 pg — ABNORMAL LOW (ref 26.0–34.0)
MCHC: 30.3 g/dL (ref 30.0–36.0)
MCV: 69.4 fL — ABNORMAL LOW (ref 80.0–100.0)
Platelets: 444 10*3/uL — ABNORMAL HIGH (ref 150–400)
RBC: 3.95 MIL/uL (ref 3.87–5.11)
RDW: 18.1 % — ABNORMAL HIGH (ref 11.5–15.5)
WBC: 9.7 10*3/uL (ref 4.0–10.5)
nRBC: 0.3 % — ABNORMAL HIGH (ref 0.0–0.2)

## 2023-07-13 MED ORDER — ROSUVASTATIN CALCIUM 10 MG PO TABS
10.0000 mg | ORAL_TABLET | Freq: Every day | ORAL | 0 refills | Status: DC
  Filled 2023-07-13: qty 30, 30d supply, fill #0

## 2023-07-13 MED ORDER — SODIUM CHLORIDE 0.9 % IV SOLN
500.0000 mg | Freq: Once | INTRAVENOUS | Status: AC
  Administered 2023-07-13: 500 mg via INTRAVENOUS
  Filled 2023-07-13: qty 25

## 2023-07-14 LAB — URINE CULTURE: Culture: >=100000 — AB

## 2023-07-14 LAB — HAPTOGLOBIN: Haptoglobin: 213 mg/dL (ref 37–355)

## 2023-07-19 ENCOUNTER — Ambulatory Visit: Admitting: Student

## 2023-07-19 ENCOUNTER — Ambulatory Visit

## 2023-07-19 ENCOUNTER — Encounter: Payer: Self-pay | Admitting: Student

## 2023-07-19 VITALS — BP 135/82 | HR 85 | Temp 97.6°F | Ht 65.0 in | Wt 199.3 lb

## 2023-07-19 DIAGNOSIS — D509 Iron deficiency anemia, unspecified: Secondary | ICD-10-CM | POA: Diagnosis not present

## 2023-07-19 DIAGNOSIS — D649 Anemia, unspecified: Secondary | ICD-10-CM

## 2023-07-19 DIAGNOSIS — I10 Essential (primary) hypertension: Secondary | ICD-10-CM

## 2023-07-19 DIAGNOSIS — E119 Type 2 diabetes mellitus without complications: Secondary | ICD-10-CM

## 2023-07-19 DIAGNOSIS — R110 Nausea: Secondary | ICD-10-CM

## 2023-07-19 DIAGNOSIS — R7989 Other specified abnormal findings of blood chemistry: Secondary | ICD-10-CM | POA: Diagnosis not present

## 2023-07-19 MED ORDER — SEMAGLUTIDE(0.25 OR 0.5MG/DOS) 2 MG/3ML ~~LOC~~ SOPN: 0.2500 mg | PEN_INJECTOR | SUBCUTANEOUS | 0 refills | Status: DC

## 2023-07-19 MED ORDER — OZEMPIC (0.25 OR 0.5 MG/DOSE) 2 MG/3ML ~~LOC~~ SOPN: 0.2500 mg | PEN_INJECTOR | SUBCUTANEOUS | 0 refills | Status: DC

## 2023-07-19 MED ORDER — TRULICITY 1.5 MG/0.5ML ~~LOC~~ SOAJ: 1.5000 mg | SUBCUTANEOUS | 0 refills | Status: AC

## 2023-07-19 NOTE — Assessment & Plan Note (Signed)
 Good diabetic control but she is becoming intolerant to her metformin, citing GI upset, primarily diarrhea and cramps. Will prescribe formulary GLP1a. - Trulicity 1.5mg  weekly, stop metformin - RTC 1 month after starting medicine for checkup and titration if appropirate

## 2023-07-19 NOTE — Assessment & Plan Note (Signed)
 In past was on amlodipine 5 and losartan 100 daily. This was held after hospitalization as she was hypotensive as low as 103/44. Today blood pressure initially elevated 150 systolic and down to 135/82 on recheck. She showed me home measurements this week ranging 110s - 141 max systolic, and diastolic average 80. As a diabetic I would count her goal 130/80 and she is near goal and very hesitant to resume her antihypertensives, which she states made her feel fatigued and malaise. I will not resume her meds today but in return she will keep a detailed log and we will resume her meds at next visit if needed. - continue to hold amlodipine 5 and losartan 100.

## 2023-07-19 NOTE — Progress Notes (Signed)
   CC: HFU symptommatic ID anemia  HPI:  [name] is a 68 y.o. female with a PMH stated below who presents today for hospital followup. She states she is well and is without complaint  Please see problem based assessment and plan for additional details.  Past Medical History:  Diagnosis Date   ADD (attention deficit disorder)    Atopic dermatitis    Congenital dilation of aortic arch    Depression    Diabetes mellitus without complication (HCC)    DOE (dyspnea on exertion)    DOE (dyspnea on exertion)    GERD (gastroesophageal reflux disease)    HTN (hypertension)    IFG (impaired fasting glucose)    Neuropathy    Seasonal allergies     Review of Systems: ROS negative except for what is noted on the assessment and plan.  Vitals:   07/19/23 1034 07/19/23 1147  BP: (!) 151/92 135/82  Pulse: 87 85  Temp: 97.6 F (36.4 C)   TempSrc: Oral   SpO2: 100%   Weight: 199 lb 4.8 oz (90.4 kg)   Height: 5\' 5"  (1.651 m)     Physical Exam: Constitutional: well-appearing woman in no acute distress Cardiovascular: regular rate and rhythm, no m/r/g Pulmonary/Chest: normal work of breathing on room air, lungs clear to auscultation bilaterally Abdominal: soft, non-tender, non-distended MSK: normal bulk and tone Neurological: alert & oriented x 3, no focal deficit Skin: warm and dry Psych: normal mood and behavior  Assessment & Plan:   Patient discussed with Dr. Antony Contras  Iron deficiency anemia, unspecified Today is hospital follow-up for recent admission due to symptomatic anemia. Admission x 1 night diagnosed IDA and received 2u PRBC. There was suspicion for GI losses but FOBT negative in hospital and so GI workup deferred to the outpatient. She does not report melena or abdominal pain and is on Prilosec for GERD. She is without complaint, recovering well, with mild but improving fatigue. She is taking iron with mild but tolerable nausea. - CBC - Continue iron  supplement 325 every day - Ref to GI for scopes - Her sister also has severe IDA. If no GI losses identified, consider TMPRSS6 genetic testing thereafter.  Essential hypertension In past was on amlodipine 5 and losartan 100 daily. This was held after hospitalization as she was hypotensive as low as 103/44. Today blood pressure initially elevated 150 systolic and down to 135/82 on recheck. She showed me home measurements this week ranging 110s - 141 max systolic, and diastolic average 80. As a diabetic I would count her goal 130/80 and she is near goal and very hesitant to resume her antihypertensives, which she states made her feel fatigued and malaise. I will not resume her meds today but in return she will keep a detailed log and we will resume her meds at next visit if needed. - continue to hold amlodipine 5 and losartan 100.  Type II diabetes mellitus (HCC) Good diabetic control but she is becoming intolerant to her metformin, citing GI upset, primarily diarrhea and cramps. Will prescribe formulary GLP1a. - Trulicity 1.5mg  weekly, stop metformin - RTC 1 month after starting medicine for checkup and titration if appropirate  Elevated serum creatinine Present and improving at discharge. - BMP  RTC 1 month after starting Trulicity for follow up of HTN and T2DM.  Katheran James, D.O. [facility] Internal Medicine, PGY-1 Phone: [phone] Date 07/19/2023 Time 1:37 PM

## 2023-07-19 NOTE — Assessment & Plan Note (Signed)
 Present and improving at discharge. - BMP

## 2023-07-19 NOTE — Patient Instructions (Addendum)
 Trulicity is a weekly injection medicine. Stop metformin if the trulicity is approved.

## 2023-07-19 NOTE — Assessment & Plan Note (Signed)
 Today is hospital follow-up for recent admission due to symptomatic anemia. Admission x 1 night diagnosed IDA and received 2u PRBC. There was suspicion for GI losses but FOBT negative in hospital and so GI workup deferred to the outpatient. She does not report melena or abdominal pain and is on Prilosec for GERD. She is without complaint, recovering well, with mild but improving fatigue. She is taking iron with mild but tolerable nausea. - CBC - Continue iron supplement 325 every day - Ref to GI for scopes - Her sister also has severe IDA. If no GI losses identified, consider TMPRSS6 genetic testing thereafter.

## 2023-07-20 ENCOUNTER — Encounter: Payer: Self-pay | Admitting: Student

## 2023-07-20 ENCOUNTER — Encounter: Payer: Self-pay | Admitting: Physician Assistant

## 2023-07-20 LAB — CBC
Hematocrit: 33.7 % — ABNORMAL LOW (ref 34.0–46.6)
Hemoglobin: 10.1 g/dL — ABNORMAL LOW (ref 11.1–15.9)
MCH: 22.5 pg — ABNORMAL LOW (ref 26.6–33.0)
MCHC: 30 g/dL — ABNORMAL LOW (ref 31.5–35.7)
MCV: 75 fL — ABNORMAL LOW (ref 79–97)
Platelets: 462 10*3/uL — ABNORMAL HIGH (ref 150–450)
RBC: 4.48 x10E6/uL (ref 3.77–5.28)
RDW: 22.9 % — ABNORMAL HIGH (ref 11.7–15.4)
WBC: 8 10*3/uL (ref 3.4–10.8)

## 2023-07-20 LAB — BMP8+ANION GAP
Anion Gap: 18 mmol/L (ref 10.0–18.0)
BUN/Creatinine Ratio: 21 (ref 12–28)
BUN: 18 mg/dL (ref 8–27)
CO2: 20 mmol/L (ref 20–29)
Calcium: 9.3 mg/dL (ref 8.7–10.3)
Chloride: 97 mmol/L (ref 96–106)
Creatinine, Ser: 0.87 mg/dL (ref 0.57–1.00)
Glucose: 100 mg/dL — ABNORMAL HIGH (ref 70–99)
Potassium: 4.6 mmol/L (ref 3.5–5.2)
Sodium: 135 mmol/L (ref 134–144)
eGFR: 73 mL/min/{1.73_m2}

## 2023-07-24 ENCOUNTER — Telehealth: Payer: Self-pay | Admitting: *Deleted

## 2023-07-24 MED ORDER — ONDANSETRON HCL 4 MG PO TABS: 4.0000 mg | ORAL_TABLET | Freq: Three times a day (TID) | ORAL | 0 refills | Status: AC | PRN

## 2023-07-24 NOTE — Telephone Encounter (Unsigned)
 Copied from CRM [id number]. Topic: Clinical - Medical Advice >> Jul 24, 2023  3:45 PM Dennison Nancy wrote: Reason for CRM: patient was admitted around 07/11/23 in  hospital was put on iron and had a hospital followup on 07/19/23 with  Katheran James  , and provider offer  some medication for her stomach and patient  turn down at the time , patient called on 07/24/23 to see if Katheran James can write the prescription of the medication he was going to prescribe for the patient at last visit  patient been  feeing nausea sometimes , some days are worse than other days , please call patient at [phone]

## 2023-07-24 NOTE — Addendum Note (Signed)
 Addended by: Katheran James on: 07/24/2023 04:34 PM   Modules accepted: Orders

## 2023-07-28 NOTE — Progress Notes (Signed)
 Internal Medicine Clinic Attending  Case discussed with the resident at the time of the visit.  We reviewed the resident's history and exam and pertinent patient test results.  I agree with the assessment, diagnosis, and plan of care documented in the resident's note.

## 2023-08-13 ENCOUNTER — Other Ambulatory Visit: Payer: Self-pay | Admitting: Student

## 2023-08-13 NOTE — Telephone Encounter (Signed)
 Copied from CRM [id number]. Topic: Clinical - Medication Refill >> Aug 13, 2023  2:51 PM Karina Lynch wrote: Most Recent Primary Care Visit:  Provider: Carleen Chary  Department: [facility]  Visit Type: OPEN ESTABLISHED  Date: 07/19/2023  Medication: rosuvastatin (CRESTOR) 10 MG tablet  Has the patient contacted their pharmacy? Yes (Agent: If yes, when and what did the pharmacy advise?)They advised her to called in the clinic and have it sent over to her primary care pharmacy.  It was filled upon your discharge from the hospital and sent to [facility]  Is this the correct pharmacy for this prescription? Yes If no, delete pharmacy and type the correct one.  This is the patient's preferred pharmacy:   [id number] - [place], [place] - [address] AT [facility] [address] [place] [address] Phone: [phone] Fax: [phone]  [facility] [address] [place] [address] Phone: [phone] Fax: [phone]   Has the prescription been filled recently? Yes  Is the patient out of the medication? Yes  Has the patient been seen for an appointment in the last year OR does the patient have an upcoming appointment? Yes  Can we respond through MyChart? Yes  Agent: Please be advised that Rx refills may take up to 3 business days. We ask that you follow-up with your pharmacy.

## 2023-08-15 MED ORDER — ROSUVASTATIN CALCIUM 10 MG PO TABS: 10.0000 mg | ORAL_TABLET | Freq: Every day | ORAL | 0 refills | Status: DC

## 2023-08-24 ENCOUNTER — Other Ambulatory Visit: Payer: Self-pay | Admitting: Internal Medicine

## 2023-08-24 ENCOUNTER — Ambulatory Visit (INDEPENDENT_AMBULATORY_CARE_PROVIDER_SITE_OTHER): Payer: Self-pay | Admitting: Student

## 2023-08-24 VITALS — BP 138/87 | HR 81 | Temp 97.7°F | Ht 65.0 in | Wt 192.7 lb

## 2023-08-24 DIAGNOSIS — E119 Type 2 diabetes mellitus without complications: Secondary | ICD-10-CM | POA: Diagnosis not present

## 2023-08-24 DIAGNOSIS — Z7985 Long-term (current) use of injectable non-insulin antidiabetic drugs: Secondary | ICD-10-CM | POA: Diagnosis not present

## 2023-08-24 DIAGNOSIS — I10 Essential (primary) hypertension: Secondary | ICD-10-CM | POA: Diagnosis not present

## 2023-08-24 DIAGNOSIS — E1169 Type 2 diabetes mellitus with other specified complication: Secondary | ICD-10-CM

## 2023-08-24 MED ORDER — TRULICITY 3 MG/0.5ML ~~LOC~~ SOAJ: 3.0000 mg | SUBCUTANEOUS | 2 refills | Status: DC

## 2023-08-24 MED ORDER — TRULICITY 1.5 MG/0.5ML ~~LOC~~ SOAJ: 1.5000 mg | SUBCUTANEOUS | 0 refills | Status: DC

## 2023-08-24 NOTE — Assessment & Plan Note (Signed)
 Patient presents with a history of well controlled T2DM with a prior A1c of 6.2 in March.  At her appointment in March, metformin  was discontinued due to adverse effects and Trulicity 1.5mg  was prescribed. They are on a regimen of Trulicity 1.5mg .  Patient denies symptoms of nausea, vomiting, diarrhea, or constipation.  Per patient: She used truicity 1.5mg  for fours weeks and did not have a refill so she was provided a one week injection of Ozempic  0.25mg  from our office. She used the ozempic  0.25mg  for one week.   Plan: -Continue Trulicty 1.5mg  for 4 weeks due to the interruption in her dosing schedule -Trulicity 3mg  weekly injection ordered for future dosing, patient was asked to check in with us  (either Mychart or phone call) prior to use of 3mg  Trulicity: Patient understands the plan  -Ophthalmology referral placed in March 2025 -Urine ACR UTD and WNL, will need testing repeated in June 2025

## 2023-08-24 NOTE — Progress Notes (Signed)
 Established Patient Office Visit  Subjective   Patient ID: Karina Lynch, female    DOB: 1955/08/02  Age: 68 y.o. MRN: [id number]  Chief Complaint  Patient presents with   Medication Refill   Follow-up    Karina Lynch is a 68 y.o. who presents to the clinic for a one month follow up of Trulicity use for T2DM and HTN. Please see problem based assessment and plan for additional details.   Patient Active Problem List   Diagnosis Date Noted   Elevated serum creatinine 07/19/2023   Iron  deficiency anemia, unspecified 07/12/2023   Iron  deficiency anemia due to chronic blood loss 07/12/2023   Hyperlipidemia associated with type 2 diabetes mellitus (HCC) 07/09/2023   Healthcare maintenance 11/09/2021   Hyponatremia 11/01/2021   Type II diabetes mellitus (HCC) 11/01/2021   GERD (gastroesophageal reflux disease) 11/01/2021   Dyspnea on exertion 07/27/2021   Essential hypertension 07/27/2021   ADHD (attention deficit hyperactivity disorder) 06/03/2012   Insomnia 06/03/2012       Objective:     BP 138/87 (BP Location: Right Arm, Patient Position: Sitting)   Pulse 81   Temp 97.7 F (36.5 C) (Oral)   Ht 5\' 5"  (1.651 m)   Wt 192 lb 11.2 oz (87.4 kg)   SpO2 100%   BMI 32.07 kg/m  BP Readings from Last 3 Encounters:  08/24/23 138/87  07/19/23 135/82  07/13/23 130/64   Wt Readings from Last 3 Encounters:  08/24/23 192 lb 11.2 oz (87.4 kg)  07/19/23 199 lb 4.8 oz (90.4 kg)  07/11/23 202 lb 13.2 oz (92 kg)      Physical Exam Vitals reviewed.  Constitutional:      General: She is not in acute distress.    Appearance: She is not ill-appearing, toxic-appearing or diaphoretic.  Cardiovascular:     Rate and Rhythm: Normal rate and regular rhythm.     Heart sounds: Normal heart sounds. No murmur heard. Pulmonary:     Effort: Pulmonary effort is normal. No respiratory distress.     Breath sounds: Normal breath sounds. No wheezing or rales.  Abdominal:     General:  Bowel sounds are normal. There is no distension.     Palpations: Abdomen is soft.     Tenderness: There is no abdominal tenderness. There is no guarding.  Musculoskeletal:     Right lower leg: No edema.     Left lower leg: No edema.  Skin:    General: Skin is warm and dry.  Neurological:     Mental Status: She is alert.  Psychiatric:        Mood and Affect: Mood and affect normal.        Speech: Speech normal.        Behavior: Behavior normal. Behavior is cooperative.     Last metabolic panel Lab Results  Component Value Date   GLUCOSE 100 (H) 07/19/2023   NA 135 07/19/2023   K 4.6 07/19/2023   CL 97 07/19/2023   CO2 20 07/19/2023   BUN 18 07/19/2023   CREATININE 0.87 07/19/2023   EGFR 73 07/19/2023   CALCIUM  9.3 07/19/2023   PHOS 3.4 11/02/2021   PROT 7.4 07/11/2023   ALBUMIN 3.7 07/11/2023   BILITOT 0.6 07/11/2023   ALKPHOS 87 07/11/2023   AST 14 (L) 07/11/2023   ALT 8 07/11/2023   ANIONGAP 11 07/13/2023   Last lipids Lab Results  Component Value Date   CHOL 186 07/05/2023   HDL 63  07/05/2023   LDLCALC 103 (H) 07/05/2023   TRIG 111 07/05/2023   CHOLHDL 3.0 07/05/2023   Last hemoglobin A1c Lab Results  Component Value Date   HGBA1C 6.2 (A) 07/05/2023      The 10-year ASCVD risk score (Arnett DK, et al., 2019) is: 13.8%    Assessment & Plan:   Problem List Items Addressed This Visit       Cardiovascular and Mediastinum   Essential hypertension - Primary   Patient was previously on Losartan  100mg  and amlodipine  5mg  but these were held after her hospitalization for normotension/patient preference. Her BP today is 138/87 without medication. At home, her blood pressure is usually 120s/70-80s. A urine ACR was also normal one year prior.   Plan: -Discontinue HTN regimen, patient understands that if her urine ACR or BP were to become abnormal, we woukd start a low dose Losartan          Endocrine   Type II diabetes mellitus (HCC)   Patient presents  with a history of well controlled T2DM with a prior A1c of 6.2 in March.  At her appointment in March, metformin  was discontinued due to adverse effects and Trulicity 1.5mg  was prescribed. They are on a regimen of Trulicity 1.5mg .  Patient denies symptoms of nausea, vomiting, diarrhea, or constipation.  Per patient: She used truicity 1.5mg  for fours weeks and did not have a refill so she was provided a one week injection of Ozempic  0.25mg  from our office. She used the ozempic  0.25mg  for one week.   Plan: -Continue Trulicty 1.5mg  for 4 weeks due to the interruption in her dosing schedule -Trulicity 3mg  weekly injection ordered for future dosing, patient was asked to check in with us  (either Mychart or phone call) prior to use of 3mg  Trulicity: Patient understands the plan  -Ophthalmology referral placed in March 2025 -Urine ACR UTD and WNL, will need testing repeated in June 2025      Relevant Medications   Dulaglutide (TRULICITY) 1.5 MG/0.5ML SOAJ   Dulaglutide (TRULICITY) 3 MG/0.5ML SOAJ (Start on 09/21/2023)    Return in about 2 months (around 10/24/2023) for DM.    Aurora Lees, DO

## 2023-08-24 NOTE — Patient Instructions (Signed)
 Thank you, Ms.Ralph Burke for allowing us  to provide your care today. Today we discussed diabetes and blood pressure.      I have ordered the following medication/changed the following medications:   Stop the following medications: Medications Discontinued During This Encounter  Medication Reason   metFORMIN  (GLUCOPHAGE -XR) 500 MG 24 hr tablet Completed Course   amLODipine  (NORVASC ) 5 MG tablet Discontinued by provider   losartan  (COZAAR ) 100 MG tablet Discontinued by provider     Start the following medications: Meds ordered this encounter  Medications   Dulaglutide (TRULICITY) 1.5 MG/0.5ML SOAJ    Sig: Inject 1.5 mg into the skin once a week.    Dispense:  2 mL    Refill:  0   Dulaglutide (TRULICITY) 3 MG/0.5ML SOAJ    Sig: Inject 3 mg as directed once a week.    Dispense:  2 mL    Refill:  2     Follow up: Send me a Mychart message in one month or call and let us  know how you are doing on the Trulicity!  Please schedule an appointment for June   Remember: To Use the 1.5mg  dose of trulicity for 4 weeks and then use the 3mg  of Trulicity afterwards   Should you have any questions or concerns please call the internal medicine clinic at [phone].     Please note that our late policy has changed.  If you are more than 15 minutes late to your appointment, you may be asked to reschedule your appointment.  Dr. Sharlon Deacon, D.O. [facility]

## 2023-08-24 NOTE — Assessment & Plan Note (Signed)
 Patient was previously on Losartan  100mg  and amlodipine  5mg  but these were held after her hospitalization for normotension/patient preference. Her BP today is 138/87 without medication. At home, her blood pressure is usually 120s/70-80s. A urine ACR was also normal one year prior.   Plan: -Discontinue HTN regimen, patient understands that if her urine ACR or BP were to become abnormal, we woukd start a low dose Losartan 

## 2023-08-29 NOTE — Progress Notes (Signed)
 Internal Medicine Clinic Attending  Case discussed with the resident at the time of the visit.  We reviewed the resident's history and exam and pertinent patient test results.  I agree with the assessment, diagnosis, and plan of care documented in the resident's note.

## 2023-09-11 NOTE — Progress Notes (Unsigned)
 09/12/2023 Karina Lynch [id number] 22-Mar-1956  Referring provider: Driscilla George, MD Primary GI doctor: Dr. [name]  ASSESSMENT AND PLAN:  IDA 07/19/2023  HGB 10.1 MCV 75 Platelets 462 07/11/2023 Iron  9 Ferritin 2  Recent Labs    07/11/23 1837 07/12/23 0453 07/12/23 1302 07/13/23 0745 07/19/23 1115  HGB 6.7* 7.2* 7.0* 8.3* 10.1*  Admission 3/13 through 07/13/2023 for symptomatic anemia Hgb 6.7 negative FOBT status post 2 units PRBC and IV iron  discharged on oral iron  and referred for EGD colon Outpatient Colonoscopy in [place], 2018 per chart but patient states around 5- 6 years, this was for screening, polyps found, we do not have this report She is still on oral iron  every other day no overt GI bleeding,  No changes in bowel habits, rare diarrhea, nausea, GERD.  Father with colon cancer age 8's - will recheck CBC, iron /ferritin -schedule EGD/colon to evaluate at [facility] Risk of bowel prep, conscious sedation, and EGD and colonoscopy were discussed.  Risks include but are not limited to dehydration, pain, bleeding, cardiopulmonary process, bowel perforation, or other possible adverse outcomes..  Treatment plan was discussed with patient, and agreed upon.  GERD/LPR  On omeprazole 20 mg, no more LPR/coughing, rare GERD, occ dysphagia with fries but consistent No melena, no NSAIDS, rare ETOH Continue omeprazole, given GERd information Consider increasing PPI pending results, declines at this time  Type 2 diabetes On trulicity, increases to 3 mg  Discussed GLP1 with the patient, mechanism of action. Gastroparesis diet given to the patient.  Patient should be instructed to hold this medications if dose falls within 7 days of endoscopic procedure, due to increased risk of retained gastric contents.   Patient Care Team: Aurora Lees, DO as PCP - General Hugh Madura, MD as PCP - Cardiology (Cardiology) Patient, No Pcp Per (General Practice)  HISTORY OF PRESENT  ILLNESS: 68 y.o. female with a past medical history listed below presents for evaluation of IDA.   Discussed the use of AI scribe software for clinical note transcription with the patient, who gave verbal consent to proceed.  History of Present Illness   Karina Lynch is a 68 year old female with symptomatic anemia who presents for follow-up regarding her anemia and gastrointestinal evaluation.  She was hospitalized from March 13th to 14th for symptomatic anemia with a hemoglobin level of 6.7, during which she received two units of blood and intravenous iron . A fecal occult blood test was negative, and further evaluation was planned for outpatient follow-up. She is currently taking oral iron  every other day. No changes in bowel habits, except for two isolated episodes of diarrhea lasting a day each. No blood in stool or melena.  She had a colonoscopy approximately five to six years ago, which revealed polyps but no other significant findings. She is uncertain about the exact timing but believes it was within the last five years. No recent colonoscopies or endoscopies since then.  She experiences occasional acid reflux and has a history of heartburn. She has difficulty swallowing certain foods, particularly potatoes and bread, which she describes as 'really sluggish'. She frequently experiences nausea. No regular use of NSAIDs or alcohol.  Her family history is significant for her father having had colon cancer in his seventies. No personal history of abdominal surgery, chest pain, or shortness of breath.      She  reports that she has never smoked. She does not have any smokeless tobacco history on file. She reports current alcohol use.  She reports that she does not use drugs.  RELEVANT GI HISTORY, IMAGING AND LABS: Results   LABS Hb: 6.7 g/dL (16/01/9603) Fecal occult blood: Negative (07/12/2023)  DIAGNOSTIC Colonoscopy: Polyps (2018) Endoscopy: Normal (2018)      CBC    Component  Value Date/Time   WBC 8.0 07/19/2023 1115   WBC 9.7 07/13/2023 0745   RBC 4.48 07/19/2023 1115   RBC 3.95 07/13/2023 0745   HGB 10.1 (L) 07/19/2023 1115   HCT 33.7 (L) 07/19/2023 1115   PLT 462 (H) 07/19/2023 1115   MCV 75 (L) 07/19/2023 1115   MCH 22.5 (L) 07/19/2023 1115   MCH 21.0 (L) 07/13/2023 0745   MCHC 30.0 (L) 07/19/2023 1115   MCHC 30.3 07/13/2023 0745   RDW 22.9 (H) 07/19/2023 1115   LYMPHSABS 1.5 07/11/2023 1837   MONOABS 0.8 07/11/2023 1837   EOSABS 0.1 07/11/2023 1837   BASOSABS 0.1 07/11/2023 1837   Recent Labs    07/11/23 1837 07/12/23 0453 07/12/23 1302 07/13/23 0745 07/19/23 1115  HGB 6.7* 7.2* 7.0* 8.3* 10.1*    CMP     Component Value Date/Time   NA 135 07/19/2023 1115   K 4.6 07/19/2023 1115   CL 97 07/19/2023 1115   CO2 20 07/19/2023 1115   GLUCOSE 100 (H) 07/19/2023 1115   GLUCOSE 143 (H) 07/13/2023 0745   BUN 18 07/19/2023 1115   CREATININE 0.87 07/19/2023 1115   CALCIUM  9.3 07/19/2023 1115   PROT 7.4 07/11/2023 1837   ALBUMIN 3.7 07/11/2023 1837   AST 14 (L) 07/11/2023 1837   ALT 8 07/11/2023 1837   ALKPHOS 87 07/11/2023 1837   BILITOT 0.6 07/11/2023 1837   GFRNONAA 58 (L) 07/13/2023 0745      Latest Ref Rng & Units 07/11/2023    6:37 PM 11/01/2021   10:42 AM  Hepatic Function  Total Protein 6.5 - 8.1 g/dL 7.4  7.2   Albumin 3.5 - 5.0 g/dL 3.7  3.8   AST 15 - 41 U/L 14  23   ALT 0 - 44 U/L 8  12   Alk Phosphatase 38 - 126 U/L 87  96   Total Bilirubin 0.0 - 1.2 mg/dL 0.6  1.3       Current Medications:   Current Outpatient Medications (Endocrine & Metabolic):    Dulaglutide (TRULICITY) 1.5 MG/0.5ML SOAJ, Inject 1.5 mg into the skin once a week.   [START ON 09/21/2023] Dulaglutide (TRULICITY) 3 MG/0.5ML SOAJ, Inject 3 mg as directed once a week.  Current Outpatient Medications (Cardiovascular):    rosuvastatin  (CRESTOR ) 10 MG tablet, Take 1 tablet (10 mg total) by mouth daily.    Current Outpatient Medications  (Hematological):    ferrous sulfate  325 (65 FE) MG EC tablet, Take 1 tablet (325 mg total) by mouth every other day. Avoid coffee/tea/citrus fruits or juices when taking iron  to help with absorption.  Current Outpatient Medications (Other):    buPROPion  (WELLBUTRIN  XL) 300 MG 24 hr tablet, Take 1 tablet (300 mg total) by mouth daily.   Dexmethylphenidate HCl 40 MG CP24, Take 1 capsule by mouth every morning.   omeprazole (PRILOSEC OTC) 20 MG tablet, Take 20 mg by mouth daily.   ondansetron  (ZOFRAN ) 4 MG tablet, Take 1 tablet (4 mg total) by mouth every 8 (eight) hours as needed for nausea or vomiting.   VITAMIN D-VITAMIN K PO, Take 1 tablet by mouth daily.  Medical History:  Past Medical History:  Diagnosis Date   ADD (attention deficit  disorder)    Atopic dermatitis    Congenital dilation of aortic arch    Depression    Diabetes mellitus without complication (HCC)    DOE (dyspnea on exertion)    DOE (dyspnea on exertion)    GERD (gastroesophageal reflux disease)    HTN (hypertension)    IFG (impaired fasting glucose)    Neuropathy    Seasonal allergies    Allergies: No Known Allergies   Surgical History:  She  has a past surgical history that includes orif left 5th finger and righr ulnar nerve tumor excision. Family History:  Her family history includes Cancer in her father and mother; Diabetes in her father.  REVIEW OF SYSTEMS  : All other systems reviewed and negative except where noted in the History of Present Illness.  PHYSICAL EXAM: BP 132/84   Pulse 94   Ht 5\' 5"  (1.651 m)   Wt 192 lb (87.1 kg)   BMI 31.95 kg/m  Physical Exam   GENERAL APPEARANCE: Well nourished, in no apparent distress. HEENT: No cervical lymphadenopathy, unremarkable thyroid , sclerae anicteric, conjunctiva pink. RESPIRATORY: Respiratory effort normal, breath sounds clear bilaterally without rales, rhonchi, or wheezing. CARDIO: Regular rate and rhythm with no murmurs, rubs, or gallops,  peripheral pulses intact. ABDOMEN: Soft, non-distended, active bowel sounds in all four quadrants, mild tenderness to palpation, no rebound, no mass appreciated. RECTAL: Declines. MUSCULOSKELETAL: Full range of motion, normal gait, without edema. SKIN: Dry, intact without rashes or lesions. No jaundice. NEURO: Alert, oriented, no focal deficits. PSYCH: Cooperative, normal mood and affect.      Edmonia Gottron, PA-C 11:34 AM

## 2023-09-12 ENCOUNTER — Encounter: Payer: Self-pay | Admitting: Physician Assistant

## 2023-09-12 ENCOUNTER — Ambulatory Visit (INDEPENDENT_AMBULATORY_CARE_PROVIDER_SITE_OTHER): Admitting: Physician Assistant

## 2023-09-12 ENCOUNTER — Other Ambulatory Visit (INDEPENDENT_AMBULATORY_CARE_PROVIDER_SITE_OTHER)

## 2023-09-12 VITALS — BP 132/84 | HR 94 | Ht 65.0 in | Wt 192.0 lb

## 2023-09-12 DIAGNOSIS — D509 Iron deficiency anemia, unspecified: Secondary | ICD-10-CM

## 2023-09-12 DIAGNOSIS — K219 Gastro-esophageal reflux disease without esophagitis: Secondary | ICD-10-CM

## 2023-09-12 DIAGNOSIS — E1169 Type 2 diabetes mellitus with other specified complication: Secondary | ICD-10-CM

## 2023-09-12 LAB — COMPREHENSIVE METABOLIC PANEL WITH GFR
ALT: 7 U/L (ref 0–35)
AST: 13 U/L (ref 0–37)
Albumin: 4.6 g/dL (ref 3.5–5.2)
Alkaline Phosphatase: 82 U/L (ref 39–117)
BUN: 19 mg/dL (ref 6–23)
CO2: 28 meq/L (ref 19–32)
Calcium: 10.2 mg/dL (ref 8.4–10.5)
Chloride: 98 meq/L (ref 96–112)
Creatinine, Ser: 1.09 mg/dL (ref 0.40–1.20)
GFR: 52.63 mL/min — ABNORMAL LOW (ref >60.00)
Glucose, Bld: 115 mg/dL — ABNORMAL HIGH (ref 70–99)
Potassium: 5.1 meq/L (ref 3.5–5.1)
Sodium: 137 meq/L (ref 135–145)
Total Bilirubin: 0.6 mg/dL (ref 0.2–1.2)
Total Protein: 8.1 g/dL (ref 6.0–8.3)

## 2023-09-12 LAB — CBC WITH DIFFERENTIAL/PLATELET
Basophils Absolute: 0 10*3/uL (ref 0.0–0.1)
Basophils Relative: 0.4 % (ref 0.0–3.0)
Eosinophils Absolute: 0.3 10*3/uL (ref 0.0–0.7)
Eosinophils Relative: 3.8 % (ref 0.0–5.0)
HCT: 41.7 % (ref 36.0–46.0)
Hemoglobin: 13.7 g/dL (ref 12.0–15.0)
Lymphocytes Relative: 32.6 % (ref 12.0–46.0)
Lymphs Abs: 2.2 10*3/uL (ref 0.7–4.0)
MCHC: 32.9 g/dL (ref 30.0–36.0)
MCV: 79.8 fl (ref 78.0–100.0)
Monocytes Absolute: 0.5 10*3/uL (ref 0.1–1.0)
Monocytes Relative: 7.9 % (ref 3.0–12.0)
Neutro Abs: 3.7 10*3/uL (ref 1.4–7.7)
Neutrophils Relative %: 55.3 % (ref 43.0–77.0)
Platelets: 343 10*3/uL (ref 150.0–400.0)
RBC: 5.23 Mil/uL — ABNORMAL HIGH (ref 3.87–5.11)
RDW: 24.4 % — ABNORMAL HIGH (ref 11.5–15.5)
WBC: 6.7 10*3/uL (ref 4.0–10.5)

## 2023-09-12 LAB — IBC + FERRITIN
Ferritin: 30.1 ng/mL (ref 10.0–291.0)
Iron: 65 ug/dL (ref 42–145)
Saturation Ratios: 13.9 % — ABNORMAL LOW (ref 20.0–50.0)
TIBC: 467.6 ug/dL — ABNORMAL HIGH (ref 250.0–450.0)
Transferrin: 334 mg/dL (ref 212.0–360.0)

## 2023-09-12 MED ORDER — NA SULFATE-K SULFATE-MG SULF 17.5-3.13-1.6 GM/177ML PO SOLN: 1.0000 | Freq: Once | ORAL | 0 refills | Status: AC

## 2023-09-12 NOTE — Patient Instructions (Addendum)
 You have been scheduled for an endoscopy and colonoscopy. Please follow the written instructions given to you at your visit today.  If you use inhalers (even only as needed), please bring them with you on the day of your procedure.  DO NOT TAKE 7 DAYS PRIOR TO TEST- Trulicity (dulaglutide) Ozempic , Wegovy  (semaglutide ) Mounjaro (tirzepatide) Bydureon Bcise (exanatide extended release)  DO NOT TAKE 1 DAY PRIOR TO YOUR TEST Rybelsus  (semaglutide ) Adlyxin (lixisenatide) Victoza (liraglutide) Byetta (exanatide) ___________________________________________________________________________  Due to recent changes in healthcare laws, you may see the results of your imaging and laboratory studies on MyChart before your provider has had a chance to review them.  We understand that in some cases there may be results that are confusing or concerning to you. Not all laboratory results come back in the same time frame and the provider may be waiting for multiple results in order to interpret others.  Please give us  48 hours in order for your provider to thoroughly review all the results before contacting the office for clarification of your results.    _______________________________________________________  If your blood pressure at your visit was 140/90 or greater, please contact your primary care physician to follow up on this.  _______________________________________________________  If you are age 5 or older, your body mass index should be between 23-30. Your Body mass index is 31.95 kg/m. If this is out of the aforementioned range listed, please consider follow up with your Primary Care Provider.  If you are age 44 or younger, your body mass index should be between 19-25. Your Body mass index is 31.95 kg/m. If this is out of the aformentioned range listed, please consider follow up with your Primary Care Provider.   ________________________________________________________  The [facility]  providers would like to encourage you to use MYCHART to communicate with providers for non-urgent requests or questions.  Due to long hold times on the telephone, sending your provider a message by [facility] may be a faster and more efficient way to get a response.  Please allow 48 business hours for a response.  Please remember that this is for non-urgent requests.  _______________________________________________________   I appreciate the  opportunity to care for you  Thank You   [facility]

## 2023-09-13 ENCOUNTER — Ambulatory Visit: Payer: Self-pay | Admitting: Physician Assistant

## 2023-09-13 LAB — IGA: Immunoglobulin A: 506 mg/dL — ABNORMAL HIGH (ref 70–320)

## 2023-09-13 LAB — TISSUE TRANSGLUTAMINASE, IGA: (tTG) Ab, IgA: <1 U/mL

## 2023-09-17 NOTE — Progress Notes (Signed)
 I agree with the assessment and plan as outlined by Ms. Steffanie Dunn.

## 2023-09-19 ENCOUNTER — Other Ambulatory Visit: Payer: Self-pay | Admitting: Student

## 2023-10-05 ENCOUNTER — Encounter: Admitting: Internal Medicine

## 2023-10-05 ENCOUNTER — Ambulatory Visit: Admitting: Internal Medicine

## 2023-10-05 ENCOUNTER — Encounter: Payer: Self-pay | Admitting: Internal Medicine

## 2023-10-05 VITALS — BP 131/86 | HR 80 | Temp 97.3°F | Resp 18 | Ht 65.0 in | Wt 192.0 lb

## 2023-10-05 DIAGNOSIS — D123 Benign neoplasm of transverse colon: Secondary | ICD-10-CM

## 2023-10-05 DIAGNOSIS — Z800 Family history of malignant neoplasm of digestive organs: Secondary | ICD-10-CM

## 2023-10-05 DIAGNOSIS — K3189 Other diseases of stomach and duodenum: Secondary | ICD-10-CM

## 2023-10-05 DIAGNOSIS — K219 Gastro-esophageal reflux disease without esophagitis: Secondary | ICD-10-CM | POA: Diagnosis not present

## 2023-10-05 DIAGNOSIS — Z1211 Encounter for screening for malignant neoplasm of colon: Secondary | ICD-10-CM | POA: Diagnosis present

## 2023-10-05 DIAGNOSIS — D509 Iron deficiency anemia, unspecified: Secondary | ICD-10-CM | POA: Diagnosis not present

## 2023-10-05 DIAGNOSIS — K449 Diaphragmatic hernia without obstruction or gangrene: Secondary | ICD-10-CM

## 2023-10-05 DIAGNOSIS — K317 Polyp of stomach and duodenum: Secondary | ICD-10-CM | POA: Diagnosis not present

## 2023-10-05 DIAGNOSIS — K648 Other hemorrhoids: Secondary | ICD-10-CM | POA: Diagnosis not present

## 2023-10-05 DIAGNOSIS — K5730 Diverticulosis of large intestine without perforation or abscess without bleeding: Secondary | ICD-10-CM | POA: Diagnosis not present

## 2023-10-05 MED ORDER — SODIUM CHLORIDE 0.9 % IV SOLN: 500.0000 mL | Freq: Once | INTRAVENOUS | Status: DC

## 2023-10-05 NOTE — Progress Notes (Signed)
 GASTROENTEROLOGY PROCEDURE H&P NOTE   Primary Care Physician: Aurora Lees, DO    Reason for Procedure:   IDA, GERD, family history of colon cancer  Plan:    EGD/colonoscopy  Patient is appropriate for endoscopic procedure(s) in the ambulatory ([facility]) setting.  The nature of the procedure, as well as the risks, benefits, and alternatives were carefully and thoroughly reviewed with the patient. Ample time for discussion and questions allowed. The patient understood, was satisfied, and agreed to proceed.     HPI: Karina Lynch is a 68 y.o. female who presents for EGD/colonoscopy for evaluation of IDA, GERD, family history of colon cancer.  Patient was most recently seen in the Gastroenterology Clinic on 09/12/23.  No interval change in medical history since that appointment. Please refer to that note for full details regarding GI history and clinical presentation.   Past Medical History:  Diagnosis Date   ADD (attention deficit disorder)    Atopic dermatitis    Congenital dilation of aortic arch    Depression    Diabetes mellitus without complication (HCC)    DOE (dyspnea on exertion)    DOE (dyspnea on exertion)    GERD (gastroesophageal reflux disease)    HTN (hypertension)    IFG (impaired fasting glucose)    Neuropathy    Seasonal allergies     Past Surgical History:  Procedure Laterality Date   orif left 5th finger     righr ulnar nerve tumor excision      Prior to Admission medications   Medication Sig Start Date End Date Taking? Authorizing Provider  buPROPion  (WELLBUTRIN  XL) 300 MG 24 hr tablet Take 1 tablet (300 mg total) by mouth daily. 11/05/21   Vita Grip, MD  Dexmethylphenidate HCl 40 MG CP24 Take 1 capsule by mouth every morning. 06/18/23   [provider]  Dulaglutide (TRULICITY) 1.5 MG/0.5ML SOAJ Inject 1.5 mg into the skin once a week. 08/24/23   Aurora Lees, DO  Dulaglutide (TRULICITY) 3 MG/0.5ML SOAJ Inject 3 mg as directed once a  week. 09/21/23   Aurora Lees, DO  ferrous sulfate  325 (65 FE) MG EC tablet TAKE 1 TABLET(325 MG) BY MOUTH EVERY OTHER DAY. AVOID COFFEE/ TEASPOONFUL/ CITRUS FRUITS OR JUICES WHEN TAKING IRON  FOR ABSORPTION 09/21/23   Nooruddin, Saad, MD  omeprazole (PRILOSEC OTC) 20 MG tablet Take 20 mg by mouth daily.    [provider]  ondansetron  (ZOFRAN ) 4 MG tablet Take 1 tablet (4 mg total) by mouth every 8 (eight) hours as needed for nausea or vomiting. 07/24/23   Carleen Chary, DO  rosuvastatin  (CRESTOR ) 10 MG tablet Take 1 tablet (10 mg total) by mouth daily. 08/15/23 08/14/24  Aurora Lees, DO  VITAMIN D-VITAMIN K PO Take 1 tablet by mouth daily.    [provider]    Current Outpatient Medications  Medication Sig Dispense Refill   buPROPion  (WELLBUTRIN  XL) 300 MG 24 hr tablet Take 1 tablet (300 mg total) by mouth daily. 60 tablet 2   Dexmethylphenidate HCl 40 MG CP24 Take 1 capsule by mouth every morning.     Dulaglutide (TRULICITY) 1.5 MG/0.5ML SOAJ Inject 1.5 mg into the skin once a week. 2 mL 0   Dulaglutide (TRULICITY) 3 MG/0.5ML SOAJ Inject 3 mg as directed once a week. 2 mL 2   ferrous sulfate  325 (65 FE) MG EC tablet TAKE 1 TABLET(325 MG) BY MOUTH EVERY OTHER DAY. AVOID COFFEE/ TEASPOONFUL/ CITRUS FRUITS OR JUICES WHEN TAKING IRON  FOR ABSORPTION  45 tablet 0   omeprazole (PRILOSEC OTC) 20 MG tablet Take 20 mg by mouth daily.     ondansetron  (ZOFRAN ) 4 MG tablet Take 1 tablet (4 mg total) by mouth every 8 (eight) hours as needed for nausea or vomiting. 20 tablet 0   rosuvastatin  (CRESTOR ) 10 MG tablet Take 1 tablet (10 mg total) by mouth daily. 90 tablet 0   VITAMIN D-VITAMIN K PO Take 1 tablet by mouth daily.     Current Facility-Administered Medications  Medication Dose Route Frequency Provider Last Rate Last Admin   0.9 %  sodium chloride  infusion  500 mL Intravenous Once Daina Drum, MD        Allergies as of 10/05/2023   (No Known Allergies)    Family  History  Problem Relation Age of Onset   Cancer Mother    Cancer Father    Diabetes Father     Social History   Socioeconomic History   Marital status: Divorced    Spouse name: Not on file   Number of children: Not on file   Years of education: Not on file   Highest education level: Not on file  Occupational History   Not on file  Tobacco Use   Smoking status: Never   Smokeless tobacco: Never  Vaping Use   Vaping status: Never Used  Substance and Sexual Activity   Alcohol use: Yes    Comment: occ   Drug use: No   Sexual activity: Not on file  Other Topics Concern   Not on file  Social History Narrative   ** Merged History Encounter **       Social Drivers of Health   Financial Resource Strain: Low Risk  (10/24/2022)   Overall Financial Resource Strain (CARDIA)    Difficulty of Paying Living Expenses: Not very hard  Food Insecurity: No Food Insecurity (07/12/2023)   Hunger Vital Sign    Worried About Running Out of Food in the Last Year: Never true    Ran Out of Food in the Last Year: Never true  Transportation Needs: No Transportation Needs (07/12/2023)   PRAPARE - Administrator, Civil Service (Medical): No    Lack of Transportation (Non-Medical): No  Physical Activity: Not on file  Stress: Not on file  Social Connections: Socially Isolated (07/12/2023)   Social Connection and Isolation Panel [NHANES]    Frequency of Communication with Friends and Family: Once a week    Frequency of Social Gatherings with Friends and Family: Once a week    Attends Religious Services: Never    Database administrator or Organizations: No    Attends Banker Meetings: Never    Marital Status: Divorced  Catering manager Violence: Not At Risk (07/12/2023)   Humiliation, Afraid, Rape, and Kick questionnaire    Fear of Current or Ex-Partner: No    Emotionally Abused: No    Physically Abused: No    Sexually Abused: No    Physical Exam: Vital signs in last 24  hours: BP (!) 134/91   Pulse 100   Temp (!) 97.3 F (36.3 C) (Temporal)   Ht 5\' 5"  (1.651 m)   Wt 192 lb (87.1 kg)   SpO2 97%   BMI 31.95 kg/m  GEN: NAD EYE: Sclerae anicteric ENT: MMM CV: Non-tachycardic Pulm: No increased WOB GI: Soft NEURO:  Alert & Oriented   Regino Caprio, MD  Gastroenterology   10/05/2023 2:57 PM

## 2023-10-05 NOTE — Patient Instructions (Addendum)
 -Handout on polyps, diverticulosis hiatal hernia  provided. -await pathology results. -repeat colonoscopy for surveillance recommended. Date to be determined when pathology result become available.  -Continue present medications.  YOU HAD AN ENDOSCOPIC PROCEDURE TODAY AT THE  ENDOSCOPY CENTER:   Refer to the procedure report that was given to you for any specific questions about what was found during the examination.  If the procedure report does not answer your questions, please call your gastroenterologist to clarify.  If you requested that your care partner not be given the details of your procedure findings, then the procedure report has been included in a sealed envelope for you to review at your convenience later.  YOU SHOULD EXPECT: Some feelings of bloating in the abdomen. Passage of more gas than usual.  Walking can help get rid of the air that was put into your GI tract during the procedure and reduce the bloating. If you had a lower endoscopy (such as a colonoscopy or flexible sigmoidoscopy) you may notice spotting of blood in your stool or on the toilet paper. If you underwent a bowel prep for your procedure, you may not have a normal bowel movement for a few days.  Please Note:  You might notice some irritation and congestion in your nose or some drainage.  This is from the oxygen used during your procedure.  There is no need for concern and it should clear up in a day or so.  SYMPTOMS TO REPORT IMMEDIATELY:  Following lower endoscopy (colonoscopy or flexible sigmoidoscopy):  Excessive amounts of blood in the stool  Significant tenderness or worsening of abdominal pains  Swelling of the abdomen that is new, acute  Fever of 100F or higher  Following upper endoscopy (EGD)  Vomiting of blood or coffee ground material  New chest pain or pain under the shoulder blades  Painful or persistently difficult swallowing  New shortness of breath  Fever of 100F or higher  Black,  tarry-looking stools  For urgent or emergent issues, a gastroenterologist can be reached at any hour by calling [phone]. Do not use MyChart messaging for urgent concerns.    DIET:  We do recommend a small meal at first, but then you may proceed to your regular diet.  Drink plenty of fluids but you should avoid alcoholic beverages for 24 hours.  ACTIVITY:  You should plan to take it easy for the rest of today and you should NOT DRIVE or use heavy machinery until tomorrow (because of the sedation medicines used during the test).    FOLLOW UP: Our staff will call the number listed on your records the next business day following your procedure.  We will call around 7:15- 8:00 am to check on you and address any questions or concerns that you may have regarding the information given to you following your procedure. If we do not reach you, we will leave a message.     If any biopsies were taken you will be contacted by phone or by letter within the next 1-3 weeks.  Please call us  at [phone] if you have not heard about the biopsies in 3 weeks.    SIGNATURES/CONFIDENTIALITY: You and/or your care partner have signed paperwork which will be entered into your electronic medical record.  These signatures attest to the fact that that the information above on your After Visit Summary has been reviewed and is understood.  Full responsibility of the confidentiality of this discharge information lies with you and/or your care-partner.

## 2023-10-05 NOTE — Progress Notes (Signed)
 Pt's states no medical or surgical changes since previsit or office visit.

## 2023-10-05 NOTE — Progress Notes (Signed)
 Called to room to assist during endoscopic procedure.  Patient ID and intended procedure confirmed with present staff. Received instructions for my participation in the procedure from the performing physician.

## 2023-10-05 NOTE — Op Note (Signed)
 [facility] Patient Name: Karina Lynch Procedure Date: 10/05/2023 3:28 PM MRN: [id number] Endoscopist: Pedro Bourgeois , , [id number] Age: 68 Referring MD:  Date of Birth: 1955/07/07 Gender: Female Account #: [id number] Procedure:                Colonoscopy Indications:              Screening patient at increased risk: Family history                            of 1st-degree relative with colorectal cancer at                            age 35 years (or older) Medicines:                Monitored Anesthesia Care Procedure:                Pre-Anesthesia Assessment:                           - Prior to the procedure, a History and Physical                            was performed, and patient medications and                            allergies were reviewed. The patient's tolerance of                            previous anesthesia was also reviewed. The risks                            and benefits of the procedure and the sedation                            options and risks were discussed with the patient.                            All questions were answered, and informed consent                            was obtained. Prior Anticoagulants: The patient has                            taken no anticoagulant or antiplatelet agents. ASA                            Grade Assessment: II - A patient with mild systemic                            disease. After reviewing the risks and benefits,                            the patient was deemed in satisfactory condition to  undergo the procedure.                           After obtaining informed consent, the colonoscope                            was passed under direct vision. Throughout the                            procedure, the patient's blood pressure, pulse, and                            oxygen saturations were monitored continuously. The                            CF [id number] #[id number] was  introduced through the anus                            and advanced to the the terminal ileum. The                            colonoscopy was performed without difficulty. The                            patient tolerated the procedure well. The quality                            of the bowel preparation was excellent. The                            terminal ileum, ileocecal valve, appendiceal                            orifice, and rectum were photographed. Scope In: 3:49:39 PM Scope Out: 4:13:07 PM Scope Withdrawal Time: 0 hours 11 minutes 29 seconds  Total Procedure Duration: 0 hours 23 minutes 28 seconds  Findings:                 The terminal ileum appeared normal.                           A 4 mm polyp was found in the transverse colon. The                            polyp was sessile. The polyp was removed with a                            cold snare. Resection and retrieval were complete.                           Multiple diverticula were found in the sigmoid                            colon, descending colon and transverse colon.  Non-bleeding internal hemorrhoids were found during                            retroflexion. Complications:            No immediate complications. Estimated Blood Loss:     Estimated blood loss was minimal. Impression:               - The examined portion of the ileum was normal.                           - One 4 mm polyp in the transverse colon, removed                            with a cold snare. Resected and retrieved.                           - Diverticulosis in the sigmoid colon, in the                            descending colon and in the transverse colon.                           - Non-bleeding internal hemorrhoids. Recommendation:           - Discharge patient to home (with escort).                           - Await pathology results.                           - The findings and recommendations were discussed                             with the patient. Dr Pedro Bourgeois "[name]" [name],  10/05/2023 4:19:08 PM

## 2023-10-05 NOTE — Progress Notes (Signed)
 Sedate, gd SR, tolerated procedure well, VSS, report to RN

## 2023-10-05 NOTE — Op Note (Signed)
 Karina Lynch Patient Name: Karina Lynch Procedure Date: 10/05/2023 3:34 PM MRN: [id number] Endoscopist: Karina Lynch [place] , , [id number] Age: 68 Referring MD:  Date of Birth: 12-Aug-1955 Gender: Female Account #: [id number] Procedure:                Upper GI endoscopy Indications:              Iron  deficiency anemia, Heartburn Medicines:                Monitored Anesthesia Care Procedure:                Pre-Anesthesia Assessment:                           - Prior to the procedure, a History and Physical                            was performed, and patient medications and                            allergies were reviewed. The patient's tolerance of                            previous anesthesia was also reviewed. The risks                            and benefits of the procedure and the sedation                            options and risks were discussed with the patient.                            All questions were answered, and informed consent                            was obtained. Prior Anticoagulants: The patient has                            taken no anticoagulant or antiplatelet agents. ASA                            Grade Assessment: II - A patient with mild systemic                            disease. After reviewing the risks and benefits,                            the patient was deemed in satisfactory condition to                            undergo the procedure.                           After obtaining informed consent, the endoscope was  passed under direct vision. Throughout the                            procedure, the patient's blood pressure, pulse, and                            oxygen saturations were monitored continuously. The                            GIF HQ190 #[id number] was introduced through the                            mouth, and advanced to the second part of duodenum.                            The upper GI  endoscopy was accomplished without                            difficulty. The patient tolerated the procedure                            well. Scope In: Scope Out: Findings:                 The examined esophagus was normal.                           A 5 cm hiatal hernia was present.                           Three 5 to 10 mm sessile polyps with no bleeding                            and no stigmata of recent bleeding were found in                            the gastric body. These polyps were removed with a                            cold snare. Resection and retrieval were complete.                           Localized mildly erythematous mucosa without                            bleeding was found in the gastric antrum. Biopsies                            were taken with a cold forceps for histology.                           The examined duodenum was normal. Complications:            No immediate complications. Estimated Blood Loss:     Estimated blood loss was minimal. Impression:               -  Normal esophagus.                           - 5 cm hiatal hernia.                           - Three gastric polyps. Resected and retrieved.                           - Erythematous mucosa in the antrum. Biopsied.                           - Normal examined duodenum. Recommendation:           - Await pathology results.                           - Perform a colonoscopy today. Dr Karina Lynch "Karina Lynch" Karina Lynch,  10/05/2023 4:17:42 PM

## 2023-10-08 ENCOUNTER — Telehealth: Payer: Self-pay | Admitting: *Deleted

## 2023-10-08 NOTE — Telephone Encounter (Signed)
  Follow up Call-     10/05/2023    2:30 PM  Call back number  Post procedure Call Back phone  # [phone]  Permission to leave phone message Yes     Patient questions:  Message left to call if necessary.

## 2023-10-10 ENCOUNTER — Ambulatory Visit: Payer: Self-pay | Admitting: Internal Medicine

## 2023-10-10 LAB — SURGICAL PATHOLOGY

## 2023-10-19 ENCOUNTER — Other Ambulatory Visit: Payer: Self-pay | Admitting: Internal Medicine

## 2023-10-19 DIAGNOSIS — I10 Essential (primary) hypertension: Secondary | ICD-10-CM

## 2023-10-22 NOTE — Telephone Encounter (Signed)
 Medication discontinued 08/24/23

## 2023-11-07 ENCOUNTER — Other Ambulatory Visit: Payer: Self-pay | Admitting: Student

## 2023-11-07 NOTE — Telephone Encounter (Signed)
 Medication sent to pharmacy

## 2023-12-04 ENCOUNTER — Other Ambulatory Visit: Payer: Self-pay | Admitting: Student

## 2023-12-06 ENCOUNTER — Ambulatory Visit

## 2023-12-06 VITALS — BP 129/88 | HR 90 | Temp 97.5°F | Ht 65.0 in | Wt 186.6 lb

## 2023-12-06 DIAGNOSIS — E1169 Type 2 diabetes mellitus with other specified complication: Secondary | ICD-10-CM | POA: Diagnosis not present

## 2023-12-06 DIAGNOSIS — I10 Essential (primary) hypertension: Secondary | ICD-10-CM | POA: Diagnosis not present

## 2023-12-06 DIAGNOSIS — Z0000 Encounter for general adult medical examination without abnormal findings: Secondary | ICD-10-CM

## 2023-12-06 DIAGNOSIS — E785 Hyperlipidemia, unspecified: Secondary | ICD-10-CM

## 2023-12-06 DIAGNOSIS — Z7985 Long-term (current) use of injectable non-insulin antidiabetic drugs: Secondary | ICD-10-CM

## 2023-12-06 DIAGNOSIS — D509 Iron deficiency anemia, unspecified: Secondary | ICD-10-CM | POA: Diagnosis not present

## 2023-12-06 LAB — POCT GLYCOSYLATED HEMOGLOBIN (HGB A1C): Hemoglobin A1C: 5.9 % — AB (ref 4.0–5.6)

## 2023-12-06 LAB — GLUCOSE, CAPILLARY: Glucose-Capillary: 104 mg/dL — ABNORMAL HIGH (ref 70–99)

## 2023-12-06 MED ORDER — TRULICITY 4.5 MG/0.5ML ~~LOC~~ SOAJ: 4.5000 mg | SUBCUTANEOUS | 2 refills | Status: DC

## 2023-12-06 NOTE — Progress Notes (Signed)
 CC: Follow-up  HPI:  [name] is a 68 y.o. female living with a history stated below and presents today for follow-up of DM.  Patient says she is doing well overall.  She wanted to go up her current dose of Trulicity  which was 3 mg.  Patient mentioned that she was constipated and does not know if it was because of Trulicity  or her iron  supplement that she started about at the same time.  She denied any acute complaints.  She keeps a log of her blood pressure every day or every other day.  Patient said her blood pressure was mostly in the low 130s systolic.      Past Medical History:  Diagnosis Date   ADD (attention deficit disorder)    Atopic dermatitis    Congenital dilation of aortic arch    Depression    Diabetes mellitus without complication (HCC)    DOE (dyspnea on exertion)    DOE (dyspnea on exertion)    GERD (gastroesophageal reflux disease)    HTN (hypertension)    IFG (impaired fasting glucose)    Neuropathy    Seasonal allergies     Current Outpatient Medications on File Prior to Visit  Medication Sig Dispense Refill   buPROPion  (WELLBUTRIN  XL) 300 MG 24 hr tablet Take 1 tablet (300 mg total) by mouth daily. 60 tablet 2   Dexmethylphenidate HCl 40 MG CP24 Take 1 capsule by mouth every morning.     Dulaglutide  (TRULICITY ) 1.5 MG/0.5ML SOAJ Inject 1.5 mg into the skin once a week. 2 mL 0   Dulaglutide  (TRULICITY ) 3 MG/0.5ML SOAJ Inject 3 mg as directed once a week. 2 mL 2   ferrous sulfate  325 (65 FE) MG EC tablet TAKE 1 TABLET(325 MG) BY MOUTH EVERY OTHER DAY. AVOID COFFEE/ TEASPOONFUL/ CITRUS FRUITS OR JUICES WHEN TAKING IRON  FOR ABSORPTION (Patient not taking: Reported on 10/05/2023) 45 tablet 0   omeprazole (PRILOSEC OTC) 20 MG tablet Take 20 mg by mouth daily.     ondansetron  (ZOFRAN ) 4 MG tablet Take 1 tablet (4 mg total) by mouth every 8 (eight) hours as needed for nausea or vomiting. 20 tablet 0   rosuvastatin  (CRESTOR ) 10 MG tablet TAKE 1 TABLET(10 MG)  BY MOUTH DAILY 90 tablet 0   VITAMIN D-VITAMIN K PO Take 1 tablet by mouth daily.     No current facility-administered medications on file prior to visit.    Family History  Problem Relation Age of Onset   Cancer Mother    Cancer Father    Diabetes Father     Social History   Socioeconomic History   Marital status: Divorced    Spouse name: Not on file   Number of children: Not on file   Years of education: Not on file   Highest education level: Not on file  Occupational History   Not on file  Tobacco Use   Smoking status: Never   Smokeless tobacco: Never  Vaping Use   Vaping status: Never Used  Substance and Sexual Activity   Alcohol use: Yes    Comment: occ   Drug use: No   Sexual activity: Not on file  Other Topics Concern   Not on file  Social History Narrative   ** Merged History Encounter **       Social Drivers of Health   Financial Resource Strain: Low Risk  (10/24/2022)   Overall Financial Resource Strain (CARDIA)    Difficulty of Paying Living Expenses: Not  very hard  Food Insecurity: No Food Insecurity (07/12/2023)   Hunger Vital Sign    Worried About Running Out of Food in the Last Year: Never true    Ran Out of Food in the Last Year: Never true  Transportation Needs: No Transportation Needs (07/12/2023)   PRAPARE - Administrator, Civil Service (Medical): No    Lack of Transportation (Non-Medical): No  Physical Activity: Not on file  Stress: Not on file  Social Connections: Socially Isolated (07/12/2023)   Social Connection and Isolation Panel    Frequency of Communication with Friends and Family: Once a week    Frequency of Social Gatherings with Friends and Family: Once a week    Attends Religious Services: Never    Database administrator or Organizations: No    Attends Banker Meetings: Never    Marital Status: Divorced  Catering manager Violence: Not At Risk (07/12/2023)   Humiliation, Afraid, Rape, and Kick  questionnaire    Fear of Current or Ex-Partner: No    Emotionally Abused: No    Physically Abused: No    Sexually Abused: No    Review of Systems: ROS  All pertinent review of systems HPI and plan Vitals:   12/06/23 1539  BP: (!) 143/97  Pulse: 94  Temp: (!) 97.5 F (36.4 C)  TempSrc: Oral  SpO2: 97%  Weight: 186 lb 9.6 oz (84.6 kg)  Height: 5' 5 (1.651 m)    Physical Exam: Physical Exam HENT:     Head: Normocephalic.  Cardiovascular:     Rate and Rhythm: Normal rate and regular rhythm.     Comments: +2 radial pulses bilaterally  Neurological:     Mental Status: She is alert.      Assessment & Plan:     Patient seen with Dr. Karna  Assessment & Plan Type 2 diabetes mellitus with other specified complication, without long-term current use of insulin (HCC) Patient's current A1c was 5.9 which reduced from her previous A1c of 6.2 months ago.  Patient is tolerating her current dose of Trulicity  3 mg well.  We discussed that her A1c looked good and she can either stay on her current dose of Trulicity  or can go up.  We discussed that going up might help the patient with weight loss as her BMI was still high.  Patient agreed to start taking 4.5 mg Trulicity . --Start Trulicity  4.5 mg weekly. --Stop Trulicity  3 mg weekly. --Urine ACR collected --Patient said she would get her eye exam done soon and send us  the results --Recheck A1c at next visit --F/u in 3 months Essential hypertension Patient's blood pressure 143/97 with a recheck of 129/88.  We discussed with the patient that we would like her blood pressure goal to be less than 130s and that can be handled by either starting her back on blood pressure meds or making lifestyle changes for weight loss.  Patient did not want to start on any blood pressure medication and said she would go with the latter option. --Monitor blood pressure daily-- goal: <130s systolic Hyperlipidemia associated with type 2 diabetes mellitus  (HCC) Patient's last lipid panel was in March 2025 which showed slight elevation in LDL levels of 103.  We would like her LDL goal to be less than 100.  Will repeat lipid panel during next visit. --Continue taking home rosuvastatin  rosuvastatin  10 milligrams daily --Check lipid panel during next visit Iron  deficiency anemia, unspecified iron  deficiency anemia type Patient had  iron  deficiency anemia and has been taking over-the-counter iron  supplements to help her with this.  She was admitted to the hospital in March where she received 2 units of blood due to hemoglobin levels of 6.7.  Her hemoglobin on 09/12/2023 was 13.7.  Asked patient to continue taking her current iron  supplements.  Advised her to take OTC MiraLAX  to help with constipation. -- Continue ferrous sulfate  1 tablet every other day --Take OTC MiraLAX  as needed for constipation  Healthcare maintenance Patient is due for a mammogram. --Referral for mammogram placed   Orders Placed This Encounter  Procedures   Microalbumin / Creatinine Urine Ratio   Glucose, capillary   POC Hbg A1C     Rebecka Pion, D.O. [facility], PGY-1 Date 12/06/2023 Time 3:53 PM

## 2023-12-06 NOTE — Patient Instructions (Signed)
 You were seen for a follow-up visit for diabetes.  Please follow the instructions as discussed and today's plan: --New order for Trulicity  4.5 mg placed. --Bard some labs today and discussed results with you regarding diabetes. --Please make an appointment to see us  in 3 months

## 2023-12-07 LAB — MICROALBUMIN / CREATININE URINE RATIO
Creatinine, Urine: 97.7 mg/dL
Microalb/Creat Ratio: 33 mg/g{creat} — ABNORMAL HIGH (ref 0–29)
Microalbumin, Urine: 31.8 ug/mL

## 2023-12-07 NOTE — Assessment & Plan Note (Signed)
 Patient had iron  deficiency anemia and has been taking over-the-counter iron  supplements to help her with this.  She was admitted to the hospital in March where she received 2 units of blood due to hemoglobin levels of 6.7.  Her hemoglobin on 09/12/2023 was 13.7.  Asked patient to continue taking her current iron  supplements.  Advised her to take OTC MiraLAX  to help with constipation. -- Continue ferrous sulfate  1 tablet every other day --Take OTC MiraLAX  as needed for constipation

## 2023-12-07 NOTE — Assessment & Plan Note (Addendum)
 Patient's current A1c was 5.9 which reduced from her previous A1c of 6.2 months ago.  Patient is tolerating her current dose of Trulicity  3 mg well.  We discussed that her A1c looked good and she can either stay on her current dose of Trulicity  or can go up.  We discussed that going up might help the patient with weight loss as her BMI was still high.  Patient agreed to start taking 4.5 mg Trulicity . --Start Trulicity  4.5 mg weekly. --Stop Trulicity  3 mg weekly. --Urine ACR collected --Patient said she would get her eye exam done soon and send us  the results --Recheck A1c at next visit --F/u in 3 months

## 2023-12-07 NOTE — Assessment & Plan Note (Signed)
 Patient is due for a mammogram. --Referral for mammogram placed

## 2023-12-07 NOTE — Assessment & Plan Note (Signed)
 Patient's blood pressure 143/97 with a recheck of 129/88.  We discussed with the patient that we would like her blood pressure goal to be less than 130s and that can be handled by either starting her back on blood pressure meds or making lifestyle changes for weight loss.  Patient did not want to start on any blood pressure medication and said she would go with the latter option. --Monitor blood pressure daily-- goal: <130s systolic

## 2023-12-07 NOTE — Assessment & Plan Note (Signed)
 Patient's last lipid panel was in March 2025 which showed slight elevation in LDL levels of 103.  We would like her LDL goal to be less than 100.  Will repeat lipid panel during next visit. --Continue taking home rosuvastatin  rosuvastatin  10 milligrams daily --Check lipid panel during next visit

## 2023-12-10 NOTE — Progress Notes (Signed)
 Internal Medicine Clinic Attending  I was physically present during the key portions of the resident provided service and participated in the medical decision making of patient's management care. I reviewed pertinent patient test results.  The assessment, diagnosis, and plan were formulated together and I agree with the documentation in the resident's note.  Borderline home and in-office blood pressures, goal <130/80. Given small amount of proteinuria on ACR, would recommend at least low dose ARB for renal protection and hypertension treatment. Would continue to discuss this at future visits given patient hesitancy.  Karna Fellows, MD

## 2023-12-14 ENCOUNTER — Ambulatory Visit: Payer: Self-pay

## 2023-12-14 NOTE — Telephone Encounter (Signed)
 Left message regarding her Urine ACR. Mentioned that she had mild proteinuria and that she would benefit from an ACE/ARB in the future which would also help with her BP. We had discussed it in her recent visit but pt was hesitant on starting another medication. Told in the message that we can talk about it during future visit.

## 2024-01-07 ENCOUNTER — Other Ambulatory Visit: Payer: Self-pay

## 2024-01-07 MED ORDER — FERROUS SULFATE 325 (65 FE) MG PO TBEC: 325.0000 mg | DELAYED_RELEASE_TABLET | Freq: Every day | ORAL | 0 refills | Status: AC

## 2024-02-16 ENCOUNTER — Other Ambulatory Visit: Payer: Self-pay | Admitting: Student

## 2024-02-18 NOTE — Telephone Encounter (Signed)
 Medication sent to pharmacy

## 2024-03-03 ENCOUNTER — Ambulatory Visit: Payer: Self-pay | Admitting: Student

## 2024-03-03 VITALS — BP 109/82 | HR 101 | Temp 97.4°F | Ht 65.0 in | Wt 188.2 lb

## 2024-03-03 DIAGNOSIS — Z23 Encounter for immunization: Secondary | ICD-10-CM

## 2024-03-03 DIAGNOSIS — R809 Proteinuria, unspecified: Secondary | ICD-10-CM | POA: Diagnosis not present

## 2024-03-03 DIAGNOSIS — Z79899 Other long term (current) drug therapy: Secondary | ICD-10-CM

## 2024-03-03 DIAGNOSIS — E1169 Type 2 diabetes mellitus with other specified complication: Secondary | ICD-10-CM

## 2024-03-03 DIAGNOSIS — Z7985 Long-term (current) use of injectable non-insulin antidiabetic drugs: Secondary | ICD-10-CM

## 2024-03-03 DIAGNOSIS — E785 Hyperlipidemia, unspecified: Secondary | ICD-10-CM | POA: Diagnosis not present

## 2024-03-03 DIAGNOSIS — D509 Iron deficiency anemia, unspecified: Secondary | ICD-10-CM | POA: Diagnosis not present

## 2024-03-03 DIAGNOSIS — Z833 Family history of diabetes mellitus: Secondary | ICD-10-CM

## 2024-03-03 DIAGNOSIS — Z1231 Encounter for screening mammogram for malignant neoplasm of breast: Secondary | ICD-10-CM

## 2024-03-03 LAB — POCT GLYCOSYLATED HEMOGLOBIN (HGB A1C): HbA1c, POC (controlled diabetic range): 5.4 % (ref 0.0–7.0)

## 2024-03-03 LAB — GLUCOSE, CAPILLARY: Glucose-Capillary: 113 mg/dL — ABNORMAL HIGH (ref 70–99)

## 2024-03-03 NOTE — Assessment & Plan Note (Signed)
 On statin for primary prevention, will recheck lipids today.

## 2024-03-03 NOTE — Assessment & Plan Note (Addendum)
 Anemia of unclear etiology. 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 The patient has been taking oral iron  every other day and MiraLAX  for constipation. Her CBC five months ago showed normal hemoglobin.  Plan: Repeat CBC and iron  studies. If results remain stable, she may discontinue iron  supplementation.

## 2024-03-03 NOTE — Assessment & Plan Note (Addendum)
 Well-controlled type 2 diabetes mellitus, currently on maximum dose Trulicity  4.5 mg weekly mainly for obesity management. She is scheduled for a diabetic eye exam with ophthalmology in December 2025.  Plan: Continue current treatment regimen.

## 2024-03-03 NOTE — Assessment & Plan Note (Signed)
 Moderately increased microalbumin UACR, ideally she will be on an ARB/SGLT2 however deferred as she has softer blood pressures. -Will monitor.

## 2024-03-03 NOTE — Patient Instructions (Signed)
 It was a pleasure taking care of you today!    I will order some labs today and call you with the results once they are available.  Continue Trulicity  4.5 mg as prescribed.  I will place a referral for your mammogram -- someone from our office will call you to schedule this appointment.  I have ordered the following labs for you:   Lab Orders         Glucose, capillary         POC Hbg A1C       Follow up: 3 months   Should you have any questions or concerns please call the internal medicine clinic at [phone].     Missy Sandhoff, MD  [facility]

## 2024-03-03 NOTE — Assessment & Plan Note (Signed)
Flu shot administered.

## 2024-03-03 NOTE — Progress Notes (Signed)
 CC: FU on chronic medical problems  HPI:  Ms.Genesia V Hun is a 68 y.o. female with a history of well-controlled type 2 diabetes, iron  deficiency anemia, who presents for follow-up.  No acute concerns.  She is taking the iron  tablets every other day as prescribed.   Please see problem based assessment and plan for additional details.  Past Medical History:  Diagnosis Date   ADD (attention deficit disorder)    Atopic dermatitis    Congenital dilation of aortic arch    Depression    Diabetes mellitus without complication (HCC)    DOE (dyspnea on exertion)    DOE (dyspnea on exertion)    GERD (gastroesophageal reflux disease)    HTN (hypertension)    IFG (impaired fasting glucose)    Neuropathy    Seasonal allergies     Current Outpatient Medications on File Prior to Visit  Medication Sig Dispense Refill   buPROPion  (WELLBUTRIN  XL) 300 MG 24 hr tablet Take 1 tablet (300 mg total) by mouth daily. 60 tablet 2   Dexmethylphenidate HCl 40 MG CP24 Take 1 capsule by mouth every morning.     Dulaglutide  (TRULICITY ) 4.5 MG/0.5ML SOAJ Inject 4.5 mg as directed once a week. 2 mL 2   ferrous sulfate  325 (65 FE) MG EC tablet Take 1 tablet (325 mg total) by mouth daily with breakfast. 90 tablet 0   omeprazole (PRILOSEC OTC) 20 MG tablet Take 20 mg by mouth daily.     ondansetron  (ZOFRAN ) 4 MG tablet Take 1 tablet (4 mg total) by mouth every 8 (eight) hours as needed for nausea or vomiting. 20 tablet 0   rosuvastatin  (CRESTOR ) 10 MG tablet TAKE 1 TABLET(10 MG) BY MOUTH DAILY 90 tablet 0   VITAMIN D-VITAMIN K PO Take 1 tablet by mouth daily.     No current facility-administered medications on file prior to visit.    Review of Systems: ROS negative except for what is noted on the assessment and plan.  Vitals:   03/03/24 1112  BP: 109/82  Pulse: (!) 101  Temp: (!) 97.4 F (36.3 C)  TempSrc: Oral  SpO2: 94%  Weight: 188 lb 3.2 oz (85.4 kg)  Height: 5' 5 (1.651 m)    Physical  Exam: Constitutional: NAD Cardiovascular: RRR, no murmurs. Pulmonary/Chest: Clear bilateral lungs Abdominal: soft, non-tender, non-distended.  Assessment & Plan:   Patient discussed with Dr. Lovie  Assessment & Plan Type 2 diabetes mellitus with other specified complication, without long-term current use of insulin (HCC) Well-controlled type 2 diabetes mellitus, currently on maximum dose Trulicity  4.5 mg weekly mainly for obesity management. She is scheduled for a diabetic eye exam with ophthalmology in December 2025.  Plan: Continue current treatment regimen. Iron  deficiency anemia, unspecified iron  deficiency anemia type Anemia of unclear etiology. 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 The patient has been taking oral iron  every other day and MiraLAX  for constipation. Her CBC five months ago showed normal hemoglobin.  Plan: Repeat CBC and iron  studies. If results remain stable, she may discontinue iron  supplementation. Proteinuria, unspecified type Moderately increased microalbumin UACR, ideally she will be on an ARB/SGLT2 however deferred as she has softer blood pressures. -Will monitor. Hyperlipidemia associated with type 2 diabetes mellitus (HCC) On statin for primary prevention, will recheck lipids today. Encounter for immunization Flu shot administered. Breast cancer screening by mammogram Referred for mammogram.  Orders Placed This Encounter  Procedures   MM 3D  SCREENING MAMMOGRAM BILATERAL BREAST   Flu vaccine HIGH DOSE PF(Fluzone Trivalent)   Glucose, capillary   Lipid Profile   CBC   Iron , TIBC and Ferritin Panel   POC Hbg A1C    Missy Sandhoff, MD [facility] Internal Medicine, PGY-2  Date 03/03/2024 Time 11:59 AM

## 2024-03-03 NOTE — Assessment & Plan Note (Deleted)
 On p.o. iron  supplementation for about 3 months. -Recheck CBC and ferritin, if normal patient can discontinue.

## 2024-03-03 NOTE — Assessment & Plan Note (Signed)
Referred for mammogram 

## 2024-03-04 ENCOUNTER — Ambulatory Visit: Payer: Self-pay | Admitting: Student

## 2024-03-04 LAB — LIPID PANEL
Chol/HDL Ratio: 2.5 ratio (ref 0.0–4.4)
Cholesterol, Total: 178 mg/dL (ref 100–199)
HDL: 70 mg/dL (ref >39)
LDL Chol Calc (NIH): 80 mg/dL (ref 0–99)
Triglycerides: 168 mg/dL — ABNORMAL HIGH (ref 0–149)
VLDL Cholesterol Cal: 28 mg/dL (ref 5–40)

## 2024-03-04 LAB — IRON,TIBC AND FERRITIN PANEL
Ferritin: 47 ng/mL (ref 15–150)
Iron Saturation: 20 % (ref 15–55)
Iron: 74 ug/dL (ref 27–139)
Total Iron Binding Capacity: 373 ug/dL (ref 250–450)
UIBC: 299 ug/dL (ref 118–369)

## 2024-03-04 LAB — CBC
Hematocrit: 48.2 % — ABNORMAL HIGH (ref 34.0–46.6)
Hemoglobin: 15.4 g/dL (ref 11.1–15.9)
MCH: 29.1 pg (ref 26.6–33.0)
MCHC: 32 g/dL (ref 31.5–35.7)
MCV: 91 fL (ref 79–97)
Platelets: 329 x10E3/uL (ref 150–450)
RBC: 5.29 x10E6/uL — ABNORMAL HIGH (ref 3.77–5.28)
RDW: 12.9 % (ref 11.7–15.4)
WBC: 12.6 x10E3/uL — ABNORMAL HIGH (ref 3.4–10.8)

## 2024-03-04 NOTE — Progress Notes (Signed)
 Assessment / Plan: CBC shows normal hemoglobin. Iron  studies demonstrate improvement in ferritin from 30 ? 47 ng/mL, with TIBC around 20 mol/L, findings consistent with adequate iron  repletion. -Will discontinue oral iron  supplementation. -Recheck ferritin in 3-6 months to monitor for recurrence. -If ferritin or hemoglobin begin to decline, will refer to Gastroenterology for capsule endoscopy, given prior negative EGD and colonoscopy for occult bleeding.  Discussed findings and plan with the patient via phone call. She understands the recommendation to discontinue iron  tablets and verbalized understanding.

## 2024-03-04 NOTE — Progress Notes (Signed)
 On statin for primary prevention, LDL at goal (<100)  Patient notified via call.

## 2024-03-04 NOTE — Progress Notes (Signed)
 CBC shows mildly elevated WBC. Patient denies infectious symptoms or recent steroid use. Will monitor.

## 2024-03-04 NOTE — Progress Notes (Signed)
 Internal Medicine Clinic Attending  Case discussed with the resident at the time of the visit.  We reviewed the resident's history and exam and pertinent patient test results.  I agree with the assessment, diagnosis, and plan of care documented in the resident's note.

## 2024-03-06 ENCOUNTER — Other Ambulatory Visit: Payer: Self-pay | Admitting: Student

## 2024-03-06 DIAGNOSIS — E1169 Type 2 diabetes mellitus with other specified complication: Secondary | ICD-10-CM

## 2024-04-01 LAB — OPHTHALMOLOGY REPORT-SCANNED

## 2024-04-08 ENCOUNTER — Telehealth: Payer: Self-pay | Admitting: *Deleted

## 2024-04-08 NOTE — Telephone Encounter (Signed)
 Patient was contacted by [facility] to schedule mammogram/office is waiting for call back from patient to return call to schedule.

## 2024-04-29 ENCOUNTER — Telehealth: Payer: Self-pay | Admitting: *Deleted

## 2024-04-29 NOTE — Telephone Encounter (Signed)
 Called to follow up with patient to get her mammogram scheduled- LVM for her to call the breast center at [phone] to make that appointment. The breast center had reached out to patient but patient never called them back.

## 2024-05-15 ENCOUNTER — Other Ambulatory Visit: Payer: Self-pay

## 2024-05-15 MED ORDER — ROSUVASTATIN CALCIUM 10 MG PO TABS: 10.0000 mg | ORAL_TABLET | Freq: Every day | ORAL | 3 refills | Status: AC

## 2024-06-02 ENCOUNTER — Ambulatory Visit: Payer: Self-pay | Admitting: Student

## 2024-06-03 ENCOUNTER — Telehealth: Payer: Self-pay | Admitting: *Deleted

## 2024-06-03 DIAGNOSIS — Z13820 Encounter for screening for osteoporosis: Secondary | ICD-10-CM

## 2024-06-03 DIAGNOSIS — S52514S Nondisplaced fracture of right radial styloid process, sequela: Secondary | ICD-10-CM

## 2024-06-03 NOTE — Telephone Encounter (Signed)
 Orders Placed This Encounter  Procedures   DG BONE DENSITY (DXA)    Standing Status:   Future    Expiration Date:   06/03/2025    Reason for Exam (SYMPTOM  OR DIAGNOSIS REQUIRED):   osteoporosis screening    Preferred imaging location?:   External   Ozell Kung MD 06/03/2024, 1:53 PM

## 2024-06-03 NOTE — Telephone Encounter (Signed)
 Call to Jenna - patient has been seen by Ortho already for the fall.  Will forward to PCP to see if the PCP will be will ing to order the Bone Density.  the Copied from CRM #[id number]. Topic: Clinical - Request for Lab/Test Order >> Jun 02, 2024 12:19 PM Carrielelia G wrote: Reason for CRM: Jenna with [employer] is calling because it was reported that pt Rossitto had an incident causing a fracture in December 2025. So they are asking provider Bender to schedule patient for a bone density   Please advise

## 2024-06-04 ENCOUNTER — Telehealth: Payer: Self-pay | Admitting: *Deleted

## 2024-06-04 ENCOUNTER — Ambulatory Visit

## 2024-06-04 VITALS — BP 139/89 | HR 88 | Temp 97.3°F | Ht 65.0 in | Wt 188.4 lb

## 2024-06-04 DIAGNOSIS — D509 Iron deficiency anemia, unspecified: Secondary | ICD-10-CM

## 2024-06-04 DIAGNOSIS — Z0000 Encounter for general adult medical examination without abnormal findings: Secondary | ICD-10-CM | POA: Insufficient documentation

## 2024-06-04 DIAGNOSIS — E1169 Type 2 diabetes mellitus with other specified complication: Secondary | ICD-10-CM

## 2024-06-04 LAB — POCT GLYCOSYLATED HEMOGLOBIN (HGB A1C): HbA1c, POC (controlled diabetic range): 5.3 % (ref 0.0–7.0)

## 2024-06-04 LAB — GLUCOSE, CAPILLARY: Glucose-Capillary: 106 mg/dL — ABNORMAL HIGH (ref 70–99)

## 2024-06-04 MED ORDER — EMPAGLIFLOZIN 10 MG PO TABS: 10.0000 mg | ORAL_TABLET | Freq: Every day | ORAL | 11 refills | Status: AC

## 2024-06-04 NOTE — Telephone Encounter (Signed)
 Mammogram appointment given to patient in hand in the office- appointment February 17.2026 11:40 am to arrive 15 minutes early.

## 2024-06-04 NOTE — Assessment & Plan Note (Signed)
 A1c at this visit is 5.4 Well controlled on Trulicity  4.5 mg. Patient was noted to have mild proteinuria in the past but SGLT2 was not started due to hypotension. Patient states she has been checking blood pressure at home and it has been around the 130s SBP. Will start sglt2 inhibitor   Plan: Continue trulicity  4.5 mg  Start jardiance  10 mg  Check A1c yearly

## 2024-06-04 NOTE — Patient Instructions (Addendum)
 Today we discussed the following medical conditions and plan:   To help protect your kidneys, we will start jardiance  10 mg today. If you start feeling lightheaded or dizzy let us  know  The number for scheduling the mammogram is [phone]  Please schedule your mammogram and the bone density as well   We look forward to seeing you next time. Please call our clinic at [phone] if you have any questions or concerns. The best time to call is Monday-Friday from 9am-4pm, but there is someone available 24/7. If you need medication refills, please notify your pharmacy one week in advance and they will send us  a request.   Thank you for trusting me with your care. Wishing you the best!   Robbert Langlinais D'Mello, DO  [facility]

## 2024-06-04 NOTE — Assessment & Plan Note (Signed)
 Patient denies any hematochezia, melena, epistaxis. Has been off of oral iron  supplementation. Had previously had Hgb drop to 7 with hypotension but most recent value was 15.3. Patient states that she has followed with GI as well. Last ferritin was 47. No need for CBC at this time or repeat ferritin labs. Can reconsider in 3 months or if patient becomes symptomatic

## 2024-06-04 NOTE — Progress Notes (Signed)
 Internal Medicine Clinic Attending  Case discussed with the resident at the time of the visit.  We reviewed the resident's history and exam and pertinent patient test results.  I agree with the assessment, diagnosis, and plan of care documented in the resident's note.

## 2024-06-04 NOTE — Progress Notes (Signed)
" ° °  Established Patient Office Visit  Subjective   Patient ID: Karina Lynch, female    DOB: 1955-05-21  Age: 69 y.o. MRN: [id number]  Chief Complaint  Patient presents with   Follow-up   Medication Refill    HPI PMH of ADHD, Hypertension, GERD, HLD with Type 2 DM, that presents for follow of chronic conditions    ROS    Objective:     BP 139/89 (BP Location: Left Arm, Patient Position: Sitting, Cuff Size: Normal)   Pulse 88   Temp (!) 97.3 F (36.3 C) (Oral)   Ht 5' 5 (1.651 m)   Wt 188 lb 6.4 oz (85.5 kg)   SpO2 96%   BMI 31.35 kg/m    Physical Exam   Results for orders placed or performed in visit on 06/04/24  Glucose, capillary  Result Value Ref Range   Glucose-Capillary 106 (H) 70 - 99 mg/dL  POC Hbg J8R  Result Value Ref Range   Hemoglobin A1C     HbA1c POC (<> result, manual entry)     HbA1c, POC (prediabetic range)     HbA1c, POC (controlled diabetic range) 5.3 0.0 - 7.0 %      The 10-year ASCVD risk score (Arnett DK, et al., 2019) is: 14.9%    Assessment & Plan:   Problem List Items Addressed This Visit       Endocrine   Type II diabetes mellitus (HCC) - Primary   Relevant Medications   empagliflozin  (JARDIANCE ) 10 MG TABS tablet   Other Relevant Orders   POC Hbg A1C (Completed)     Other   Iron  deficiency anemia, unspecified   Healthcare maintenance   Assessment & Plan Type 2 diabetes mellitus with other specified complication, without long-term current use of insulin (HCC) A1c at this visit is 5.4 Well controlled on Trulicity  4.5 mg. Patient was noted to have mild proteinuria in the past but SGLT2 was not started due to hypotension. Patient states she has been checking blood pressure at home and it has been around the 130s SBP. Will start sglt2 inhibitor   Plan: Continue trulicity  4.5 mg  Start jardiance  10 mg  Check A1c yearly   Iron  deficiency anemia, unspecified iron  deficiency anemia type Patient denies any hematochezia,  melena, epistaxis. Has been off of oral iron  supplementation. Had previously had Hgb drop to 7 with hypotension but most recent value was 15.3. Patient states that she has followed with GI as well. Last ferritin was 47. No need for CBC at this time or repeat ferritin labs. Can reconsider in 3 months or if patient becomes symptomatic  Healthcare maintenance Gave patient information on scheduling mammogram and bone density scan.     Return in about 6 months (around 12/02/2024).    Karina Winther D'Mello, DO Patient discussed with Dr. Shawn  "

## 2024-06-04 NOTE — Assessment & Plan Note (Signed)
 Gave patient information on scheduling mammogram and bone density scan.

## 2024-06-05 ENCOUNTER — Telehealth: Payer: Self-pay | Admitting: Student

## 2024-06-05 DIAGNOSIS — E1169 Type 2 diabetes mellitus with other specified complication: Secondary | ICD-10-CM

## 2024-06-05 MED ORDER — TRULICITY 4.5 MG/0.5ML ~~LOC~~ SOAJ: 4.5000 mg | SUBCUTANEOUS | 2 refills | Status: AC

## 2024-06-05 NOTE — Telephone Encounter (Signed)
 Copied from CRM #[id number]. Topic: Clinical - Medication Refill >> Jun 05, 2024  9:41 AM DeAngela L wrote: Medication: TRULICITY  4.5 MG/0.5ML SOAJ The doctor thought patient had a refill at her appointment yesterday and the patient pharmacy confirms the patient did not and she is out of medication and concerned   Has the patient contacted their pharmacy? Yes  (Agent: If no, request that the patient contact the pharmacy for the refill. If patient does not wish to contact the pharmacy document the reason why and proceed with request.) (Agent: If yes, when and what did the pharmacy advise?)  This is the patient's preferred pharmacy:  [id number] - [place], [place] - [address] AT [facility] OF [address]  [place] [address] Phone: [phone] Fax: [phone]  Is this the correct pharmacy for this prescription? Yes  If no, delete pharmacy and type the correct one.   Has the prescription been filled recently? Yes   Is the patient out of the medication? Yes   Has the patient been seen for an appointment in the last year OR does the patient have an upcoming appointment? Yes   Can we respond through MyChart? Yes   Agent: Please be advised that Rx refills may take up to 3 business days. We ask that you follow-up with your pharmacy.

## 2024-06-17 ENCOUNTER — Ambulatory Visit
